# Patient Record
Sex: Female | Born: 1965 | Race: Black or African American | Hispanic: No | Marital: Single | Smoking: Never smoker
Health system: Southern US, Community
[De-identification: ages and names within clinical notes are randomized; demographics above are authoritative.]

## PROBLEM LIST (undated history)

## (undated) DIAGNOSIS — I509 Heart failure, unspecified: Secondary | ICD-10-CM

## (undated) DIAGNOSIS — I10 Essential (primary) hypertension: Secondary | ICD-10-CM

## (undated) HISTORY — DX: Heart failure, unspecified: I50.9

---

## 2003-06-23 ENCOUNTER — Other Ambulatory Visit: Payer: Self-pay

## 2004-05-10 ENCOUNTER — Emergency Department: Payer: Self-pay | Admitting: Unknown Physician Specialty

## 2004-12-05 ENCOUNTER — Emergency Department: Payer: Self-pay | Admitting: Emergency Medicine

## 2005-06-10 ENCOUNTER — Emergency Department: Payer: Self-pay | Admitting: Emergency Medicine

## 2007-06-28 ENCOUNTER — Emergency Department: Payer: Self-pay | Admitting: Emergency Medicine

## 2007-07-23 ENCOUNTER — Ambulatory Visit: Payer: Self-pay

## 2009-07-21 ENCOUNTER — Emergency Department: Payer: Self-pay | Admitting: Emergency Medicine

## 2009-10-08 ENCOUNTER — Emergency Department: Payer: Self-pay | Admitting: Emergency Medicine

## 2011-07-09 ENCOUNTER — Emergency Department: Payer: Self-pay | Admitting: Emergency Medicine

## 2011-09-05 ENCOUNTER — Ambulatory Visit: Payer: Self-pay | Admitting: Family Medicine

## 2012-10-30 ENCOUNTER — Ambulatory Visit: Payer: Self-pay | Admitting: Primary Care

## 2012-11-07 ENCOUNTER — Ambulatory Visit: Payer: Self-pay | Admitting: Primary Care

## 2013-03-23 ENCOUNTER — Emergency Department: Payer: Self-pay | Admitting: Internal Medicine

## 2013-03-23 LAB — URINALYSIS, COMPLETE
Bilirubin,UR: NEGATIVE
Blood: NEGATIVE
Glucose,UR: NEGATIVE mg/dL (ref 0–75)
Ketone: NEGATIVE
Leukocyte Esterase: NEGATIVE
Nitrite: NEGATIVE
Ph: 6 (ref 4.5–8.0)
Specific Gravity: 1.015 (ref 1.003–1.030)
WBC UR: 1 /HPF (ref 0–5)

## 2013-12-21 ENCOUNTER — Emergency Department: Payer: Self-pay | Admitting: Emergency Medicine

## 2013-12-21 LAB — COMPREHENSIVE METABOLIC PANEL
ALBUMIN: 3.4 g/dL (ref 3.4–5.0)
Alkaline Phosphatase: 62 U/L
Anion Gap: 5 — ABNORMAL LOW (ref 7–16)
BUN: 15 mg/dL (ref 7–18)
Bilirubin,Total: 0.2 mg/dL (ref 0.2–1.0)
CO2: 27 mmol/L (ref 21–32)
CREATININE: 1.02 mg/dL (ref 0.60–1.30)
Calcium, Total: 8.9 mg/dL (ref 8.5–10.1)
Chloride: 107 mmol/L (ref 98–107)
EGFR (African American): >60
EGFR (Non-African Amer.): >60
GLUCOSE: 107 mg/dL — AB (ref 65–99)
OSMOLALITY: 279 (ref 275–301)
Potassium: 3.7 mmol/L (ref 3.5–5.1)
SGOT(AST): 15 U/L (ref 15–37)
SGPT (ALT): 20 U/L
SODIUM: 139 mmol/L (ref 136–145)
TOTAL PROTEIN: 7.7 g/dL (ref 6.4–8.2)

## 2013-12-21 LAB — CBC WITH DIFFERENTIAL/PLATELET
BASOS PCT: 0.6 %
Basophil #: 0 10*3/uL (ref 0.0–0.1)
EOS ABS: 0.1 10*3/uL (ref 0.0–0.7)
Eosinophil %: 1.6 %
HCT: 36.8 % (ref 35.0–47.0)
HGB: 12.1 g/dL (ref 12.0–16.0)
LYMPHS PCT: 27.8 %
Lymphocyte #: 2 10*3/uL (ref 1.0–3.6)
MCH: 26.2 pg (ref 26.0–34.0)
MCHC: 32.8 g/dL (ref 32.0–36.0)
MCV: 80 fL (ref 80–100)
MONOS PCT: 9.7 %
Monocyte #: 0.7 x10 3/mm (ref 0.2–0.9)
NEUTROS ABS: 4.4 10*3/uL (ref 1.4–6.5)
NEUTROS PCT: 60.3 %
Platelet: 201 10*3/uL (ref 150–440)
RBC: 4.61 10*6/uL (ref 3.80–5.20)
RDW: 13.7 % (ref 11.5–14.5)
WBC: 7.3 10*3/uL (ref 3.6–11.0)

## 2013-12-21 LAB — URINALYSIS, COMPLETE
BACTERIA: NONE SEEN
BILIRUBIN, UR: NEGATIVE
Blood: NEGATIVE
GLUCOSE, UR: NEGATIVE mg/dL (ref 0–75)
Ketone: NEGATIVE
Leukocyte Esterase: NEGATIVE
Nitrite: NEGATIVE
Ph: 7 (ref 4.5–8.0)
Protein: NEGATIVE
RBC,UR: 1 /HPF (ref 0–5)
Specific Gravity: 1.02 (ref 1.003–1.030)
Squamous Epithelial: 14

## 2013-12-21 LAB — LIPASE, BLOOD: Lipase: 151 U/L (ref 73–393)

## 2013-12-21 LAB — TROPONIN I: Troponin-I: <0.02 ng/mL

## 2014-04-02 ENCOUNTER — Ambulatory Visit: Payer: Self-pay | Admitting: Primary Care

## 2015-08-07 ENCOUNTER — Emergency Department
Admission: EM | Admit: 2015-08-07 | Discharge: 2015-08-07 | Disposition: A | Discharge status: Home / Self Care | Payer: BLUE CROSS/BLUE SHIELD | Attending: Emergency Medicine | Admitting: Emergency Medicine

## 2015-08-07 ENCOUNTER — Emergency Department: Payer: BLUE CROSS/BLUE SHIELD

## 2015-08-07 ENCOUNTER — Encounter: Payer: Self-pay | Admitting: Emergency Medicine

## 2015-08-07 DIAGNOSIS — R51 Headache: Secondary | ICD-10-CM | POA: Insufficient documentation

## 2015-08-07 DIAGNOSIS — R519 Headache, unspecified: Secondary | ICD-10-CM

## 2015-08-07 DIAGNOSIS — I10 Essential (primary) hypertension: Secondary | ICD-10-CM | POA: Diagnosis not present

## 2015-08-07 HISTORY — DX: Essential (primary) hypertension: I10

## 2015-08-07 MED ORDER — PROMETHAZINE HCL 25 MG PO TABS
12.5000 mg | ORAL_TABLET | Freq: Once | ORAL | Status: AC
  Administered 2015-08-07: 12.5 mg via ORAL
  Filled 2015-08-07: qty 1

## 2015-08-07 MED ORDER — METOCLOPRAMIDE HCL 10 MG PO TABS
10.0000 mg | ORAL_TABLET | Freq: Once | ORAL | Status: AC
  Administered 2015-08-07: 10 mg via ORAL
  Filled 2015-08-07: qty 1

## 2015-08-07 MED ORDER — KETOROLAC TROMETHAMINE 30 MG/ML IJ SOLN
30.0000 mg | Freq: Once | INTRAMUSCULAR | Status: AC
  Administered 2015-08-07: 30 mg via INTRAMUSCULAR
  Filled 2015-08-07: qty 1
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
## 2015-08-07 NOTE — ED Notes (Signed)
AAOx3.  Skin warm and dry.  MAE equally and strong. Ambulates with easy and steady gait. 

## 2015-08-07 NOTE — Discharge Instructions (Signed)

## 2015-09-14 ENCOUNTER — Other Ambulatory Visit: Payer: Self-pay | Admitting: Primary Care

## 2015-09-14 DIAGNOSIS — Z1231 Encounter for screening mammogram for malignant neoplasm of breast: Secondary | ICD-10-CM

## 2015-09-25 ENCOUNTER — Emergency Department
Admission: EM | Admit: 2015-09-25 | Discharge: 2015-09-25 | Disposition: A | Discharge status: Home / Self Care | Payer: BLUE CROSS/BLUE SHIELD | Attending: Emergency Medicine | Admitting: Emergency Medicine

## 2015-09-25 ENCOUNTER — Emergency Department: Payer: BLUE CROSS/BLUE SHIELD

## 2015-09-25 ENCOUNTER — Encounter: Payer: Self-pay | Admitting: Emergency Medicine

## 2015-09-25 DIAGNOSIS — M7732 Calcaneal spur, left foot: Secondary | ICD-10-CM | POA: Insufficient documentation

## 2015-09-25 DIAGNOSIS — I10 Essential (primary) hypertension: Secondary | ICD-10-CM | POA: Insufficient documentation

## 2015-09-25 DIAGNOSIS — M722 Plantar fascial fibromatosis: Secondary | ICD-10-CM | POA: Insufficient documentation

## 2015-09-25 DIAGNOSIS — M7731 Calcaneal spur, right foot: Secondary | ICD-10-CM | POA: Insufficient documentation

## 2015-09-25 DIAGNOSIS — M2550 Pain in unspecified joint: Secondary | ICD-10-CM | POA: Diagnosis present

## 2015-09-25 DIAGNOSIS — M7730 Calcaneal spur, unspecified foot: Secondary | ICD-10-CM

## 2015-09-25 DIAGNOSIS — R52 Pain, unspecified: Secondary | ICD-10-CM

## 2015-09-25 MED ORDER — NAPROXEN 500 MG PO TABS: 500.0000 mg | ORAL_TABLET | Freq: Two times a day (BID) | ORAL | Status: DC

## 2015-09-25 NOTE — ED Provider Notes (Signed)
CSN: [id number]     Arrival date & time 09/25/15  1509 History   First MD Initiated Contact with Patient 09/25/15 1534     Chief Complaint  Patient presents with  . Foot Pain     HPI Comments: 50 year old female presents today complaining of bilateral heel pain for the past week. Reports the right heel pain radiates into her achilles tendon. She has never had this type of pain before. Has not had an injury to her feet or ankles recently. She has history of remote right 5th metatarsal fracture in the right foot. Taking OTC medications without relief of symptoms.   The history is provided by the patient.    Past Medical History  Diagnosis Date  . Hypertension    History reviewed. No pertinent past surgical history. No family history on file. Social History  Substance Use Topics  . Smoking status: Never Smoker   . Smokeless tobacco: None  . Alcohol Use: No   OB History    No data available     Review of Systems  Musculoskeletal: Positive for arthralgias. Negative for gait problem.  Skin: Negative for wound.  All other systems reviewed and are negative.     Allergies  Review of patient's allergies indicates no known allergies.  Home Medications   Prior to Admission medications   Medication Sig Start Date End Date Taking? Authorizing Provider  naproxen (NAPROSYN) 500 MG tablet Take 1 tablet (500 mg total) by mouth 2 (two) times daily with a meal. 09/25/15   Christella Scheuermann, PA-C   BP 104/64 mmHg  Pulse 88  Temp(Src) 98.2 F (36.8 C) (Oral)  Resp 20  Ht  (1.651 m)  Wt 81.647 kg  BMI 29.95 kg/m2  SpO2 100% Physical Exam  Constitutional: Vital signs are normal. She appears well-developed and well-nourished. She is active.  Musculoskeletal:       Right ankle: Normal.       Left ankle: Normal.       Right foot: Normal.       Left foot: Normal.       Feet:  Neurological: She is alert.  Nursing note and vitals reviewed.   ED Course  Procedures (including  critical care time) Labs Review Labs Reviewed - No data to display  Imaging Review Dg Foot Complete Left  09/25/2015  CLINICAL DATA:  Pain and swelling over both feet worse than left, no known injury, initial encounter EXAM: LEFT FOOT - COMPLETE 3+ VIEW COMPARISON:  None. FINDINGS: No acute fracture or dislocation is noted. Mild tarsal degenerative changes are seen. A small Achilles spur is noted. No acute soft tissue abnormality is noted. IMPRESSION: Negative. No acute abnormality noted. Electronically Signed   By: Alcide Clever M.D.   On: 09/25/2015 17:01   Dg Foot Complete Right  09/25/2015  CLINICAL DATA:  Bilateral foot pain right greater than left, no known injury, initial encounter EXAM: RIGHT FOOT COMPLETE - 3+ VIEW COMPARISON:  06/28/2007 FINDINGS: Small Achilles spur is noted. No acute fracture or dislocation is noted. No gross soft tissue abnormality is seen. Mild variant articulation between the third and fourth metatarsals is seen. This was present on the prior exam to a lesser degree. IMPRESSION: No acute abnormality noted. Electronically Signed   By: Alcide Clever M.D.   On: 09/25/2015 17:02   I have personally reviewed and evaluated these images and lab results as part of my medical decision-making.   EKG Interpretation None  MDM  Heel spurs noted on both foot xrays. Dicussed findings with patient. Handout given on plantar fascitiis Naproxen twice per day with food, follow up with podiatry for new or worsening symptoms Final diagnoses:  Pain  Heel spur, unspecified laterality  Plantar fasciitis        Christella Scheuermannmma Yazlyn Wentzel V, PA-C 09/25/15 1707  Jene Everyobert Kinner, MD 09/25/15 1940

## 2015-09-25 NOTE — ED Notes (Signed)
Bilateral heel pain x 1 week, denies injury.

## 2015-09-25 NOTE — ED Notes (Signed)
Discussed discharge instructions, prescriptions, and follow-up care with patient. No questions or concerns at this time. Pt stable at discharge.  

## 2015-09-25 NOTE — ED Notes (Signed)
See triage note. Pt presents with c/o pain to back of bil heels, right heel pain radiating into achilles tendon.

## 2015-09-27 ENCOUNTER — Ambulatory Visit: Payer: BLUE CROSS/BLUE SHIELD

## 2015-10-04 ENCOUNTER — Ambulatory Visit: Payer: BLUE CROSS/BLUE SHIELD

## 2015-10-20 ENCOUNTER — Ambulatory Visit
Admission: RE | Admit: 2015-10-20 | Discharge: 2015-10-20 | Disposition: A | Discharge status: Home / Self Care | Payer: BLUE CROSS/BLUE SHIELD | Source: Ambulatory Visit | Attending: Primary Care | Admitting: Primary Care

## 2015-10-20 DIAGNOSIS — Z1231 Encounter for screening mammogram for malignant neoplasm of breast: Secondary | ICD-10-CM | POA: Diagnosis not present

## 2016-09-19 ENCOUNTER — Other Ambulatory Visit: Payer: Self-pay | Admitting: Primary Care

## 2016-09-19 DIAGNOSIS — Z1231 Encounter for screening mammogram for malignant neoplasm of breast: Secondary | ICD-10-CM

## 2016-10-24 ENCOUNTER — Ambulatory Visit
Admission: RE | Admit: 2016-10-24 | Discharge: 2016-10-24 | Disposition: A | Discharge status: Home / Self Care | Payer: BLUE CROSS/BLUE SHIELD | Source: Ambulatory Visit | Attending: Primary Care | Admitting: Primary Care

## 2016-10-24 ENCOUNTER — Encounter: Payer: Self-pay | Admitting: Radiology

## 2016-10-24 DIAGNOSIS — Z1231 Encounter for screening mammogram for malignant neoplasm of breast: Secondary | ICD-10-CM

## 2017-11-10 ENCOUNTER — Other Ambulatory Visit: Payer: Self-pay

## 2017-11-10 ENCOUNTER — Emergency Department
Admission: EM | Admit: 2017-11-10 | Discharge: 2017-11-10 | Disposition: A | Discharge status: Home / Self Care | Payer: Self-pay | Attending: Emergency Medicine | Admitting: Emergency Medicine

## 2017-11-10 ENCOUNTER — Emergency Department: Payer: Self-pay

## 2017-11-10 ENCOUNTER — Encounter: Payer: Self-pay | Admitting: *Deleted

## 2017-11-10 DIAGNOSIS — R2241 Localized swelling, mass and lump, right lower limb: Secondary | ICD-10-CM | POA: Insufficient documentation

## 2017-11-10 DIAGNOSIS — I110 Hypertensive heart disease with heart failure: Secondary | ICD-10-CM | POA: Insufficient documentation

## 2017-11-10 DIAGNOSIS — R0602 Shortness of breath: Secondary | ICD-10-CM

## 2017-11-10 DIAGNOSIS — R079 Chest pain, unspecified: Secondary | ICD-10-CM | POA: Insufficient documentation

## 2017-11-10 DIAGNOSIS — I509 Heart failure, unspecified: Secondary | ICD-10-CM | POA: Insufficient documentation

## 2017-11-10 LAB — BASIC METABOLIC PANEL
ANION GAP: 6 (ref 5–15)
BUN: 15 mg/dL (ref 6–20)
CALCIUM: 8.7 mg/dL — AB (ref 8.9–10.3)
CO2: 22 mmol/L (ref 22–32)
Chloride: 112 mmol/L — ABNORMAL HIGH (ref 98–111)
Creatinine, Ser: 0.81 mg/dL (ref 0.44–1.00)
Glucose, Bld: 95 mg/dL (ref 70–99)
Potassium: 3.7 mmol/L (ref 3.5–5.1)
Sodium: 140 mmol/L (ref 135–145)

## 2017-11-10 LAB — LIPASE, BLOOD: LIPASE: 28 U/L (ref 11–51)

## 2017-11-10 LAB — TROPONIN I: Troponin I: <0.03 ng/mL (ref <0.03)

## 2017-11-10 LAB — HEPATIC FUNCTION PANEL
ALK PHOS: 42 U/L (ref 38–126)
ALT: 17 U/L (ref 0–44)
AST: 24 U/L (ref 15–41)
Albumin: 3.4 g/dL — ABNORMAL LOW (ref 3.5–5.0)
BILIRUBIN DIRECT: 0.1 mg/dL (ref 0.0–0.2)
BILIRUBIN INDIRECT: 0.4 mg/dL (ref 0.3–0.9)
TOTAL PROTEIN: 6.6 g/dL (ref 6.5–8.1)
Total Bilirubin: 0.5 mg/dL (ref 0.3–1.2)

## 2017-11-10 LAB — CBC
HCT: 34.6 % — ABNORMAL LOW (ref 35.0–47.0)
Hemoglobin: 11.4 g/dL — ABNORMAL LOW (ref 12.0–16.0)
MCH: 26.2 pg (ref 26.0–34.0)
MCHC: 32.9 g/dL (ref 32.0–36.0)
MCV: 79.7 fL — AB (ref 80.0–100.0)
PLATELETS: 181 10*3/uL (ref 150–440)
RBC: 4.33 MIL/uL (ref 3.80–5.20)
RDW: 13.8 % (ref 11.5–14.5)
WBC: 4.3 10*3/uL (ref 3.6–11.0)

## 2017-11-10 LAB — TSH: TSH: 0.673 u[IU]/mL (ref 0.350–4.500)

## 2017-11-10 LAB — FIBRIN DERIVATIVES D-DIMER (ARMC ONLY): Fibrin derivatives D-dimer (ARMC): 530.35 ng/mL (FEU) — ABNORMAL HIGH (ref 0.00–499.00)

## 2017-11-10 LAB — T4, FREE: Free T4: 1.03 ng/dL (ref 0.82–1.77)

## 2017-11-10 MED ORDER — FUROSEMIDE 20 MG PO TABS: 20.0000 mg | ORAL_TABLET | Freq: Every day | ORAL | 0 refills | Status: DC

## 2017-11-10 MED ORDER — FUROSEMIDE 10 MG/ML IJ SOLN
20.0000 mg | Freq: Once | INTRAMUSCULAR | Status: AC
  Administered 2017-11-10: 20 mg via INTRAVENOUS
  Filled 2017-11-10: qty 4

## 2017-11-10 MED ORDER — IOHEXOL 350 MG/ML SOLN
75.0000 mL | Freq: Once | INTRAVENOUS | Status: AC | PRN
  Administered 2017-11-10: 75 mL via INTRAVENOUS

## 2017-11-10 NOTE — ED Triage Notes (Signed)
Pt to ED reporting SOB, dizziness, headache, chest tightness and right leg swelling since yesterday. No hx of CHF, COPD or heart or lung problems. No neuro deficits noted. No increased WOB. Nonproductive cough reported last night. No fevers.   Pt also reporting neck and back pain that started today with nausea.

## 2017-11-10 NOTE — ED Provider Notes (Signed)
[facility], [facility] Emergency Department Provider Note  ____________________________________________  Time seen: Approximately 1:51 PM  I have reviewed the triage vital signs and the nursing notes.   HISTORY  Chief Complaint Shortness of Breath and Dizziness    HPI Mary Smith is a 52 y.o. female with a history of hypertension who complains of  right leg swelling that started yesterday, associated with chest tightness and shortness of breath that are intermittent without aggravating or alleviating factors.  No fevers chills or sweats.  She is also had a nonproductive cough during this time.  Denies pleuritic pain.  No dizziness or syncope.  Pain is nonradiating.  Moderate intensity.     Past Medical History:  Diagnosis Date  . Hypertension      There are no active problems to display for this patient.    History reviewed. No pertinent surgical history.   Prior to Admission medications   Medication Sig Start Date End Date Taking? Authorizing Provider  furosemide (LASIX) 20 MG tablet Take 1 tablet (20 mg total) by mouth daily. 11/10/17 11/10/18  Sharman CheekStafford, Kerrin Markman, MD  naproxen (NAPROSYN) 500 MG tablet Take 1 tablet (500 mg total) by mouth 2 (two) times daily with a meal. 09/25/15   Christella ScheuermannLawrence, Emma V, PA-C     Allergies Patient has no known allergies.   Family History  Problem Relation Age of Onset  . Breast cancer Mother [id number]  . Breast cancer Maternal Aunt   . Breast cancer Maternal Grandmother     Social History Social History   Tobacco Use  . Smoking status: Never Smoker  . Smokeless tobacco: Never Used  Substance Use Topics  . Alcohol use: No  . Drug use: Not on file    Review of Systems  Constitutional:   No fever or chills.  ENT:   No sore throat. No rhinorrhea. Cardiovascular:   Positive as above chest pain without syncope. Respiratory:   Positive shortness of breath and nonproductive cough. Gastrointestinal:   Negative for  abdominal pain, vomiting and diarrhea.  Musculoskeletal:   Positive right lower leg pain and swelling All other systems reviewed and are negative except as documented above in ROS and HPI.  ____________________________________________   PHYSICAL EXAM:  VITAL SIGNS: ED Triage Vitals  Enc Vitals Group     BP 11/10/17 1119 (!) 148/76     Pulse Rate 11/10/17 1119 76     Resp 11/10/17 1119 16     Temp 11/10/17 1119 97.8 F (36.6 C)     Temp Source 11/10/17 1119 Oral     SpO2 11/10/17 1119 98 %     Weight 11/10/17 1106 190 lb (86.2 kg)     Height 11/10/17 1106 5\' 5"  (1.651 m)     Head Circumference --      Peak Flow --      Pain Score 11/10/17 1106 10     Pain Loc --      Pain Edu? --      Excl. in GC? --     Vital signs reviewed, nursing assessments reviewed.   Constitutional:   Alert and oriented. Non-toxic appearance. Eyes:   Conjunctivae are normal. EOMI. PERRL. ENT      Head:   Normocephalic and atraumatic.      Nose:   No congestion/rhinnorhea.       Mouth/Throat:   MMM, no pharyngeal erythema. No peritonsillar mass.       Neck:   No meningismus. Full ROM. Hematological/Lymphatic/Immunilogical:  No cervical lymphadenopathy. Cardiovascular:   RRR. Symmetric bilateral radial and DP pulses.  No murmurs.  Respiratory:   Normal respiratory effort without tachypnea/retractions. Breath sounds are clear and equal bilaterally. No wheezes/rales/rhonchi. Gastrointestinal:   Soft with epigastric tenderness. Non distended. There is no CVA tenderness.  No rebound, rigidity, or guarding.  Musculoskeletal:   Normal range of motion in all extremities. No joint effusions.  Calf circumference right greater than left with mild tenderness in the posterior right calf proximally. Neurologic:   Normal speech and language.  Motor grossly intact. No acute focal neurologic deficits are appreciated.  Skin:    Skin is warm, dry and intact. No rash noted.  No petechiae, purpura, or  bullae.  ____________________________________________    LABS (pertinent positives/negatives) (all labs ordered are listed, but only abnormal results are displayed) Labs Reviewed  BASIC METABOLIC PANEL - Abnormal; Notable for the following components:      Result Value   Chloride 112 (*)    Calcium 8.7 (*)    All other components within normal limits  CBC - Abnormal; Notable for the following components:   Hemoglobin 11.4 (*)    HCT 34.6 (*)    MCV 79.7 (*)    All other components within normal limits  FIBRIN DERIVATIVES D-DIMER ([facility] ONLY) - Abnormal; Notable for the following components:   Fibrin derivatives D-dimer ([facility]) 530.35 (*)    All other components within normal limits  HEPATIC FUNCTION PANEL - Abnormal; Notable for the following components:   Albumin 3.4 (*)    All other components within normal limits  TROPONIN I  LIPASE, BLOOD  TSH  T4, FREE  TROPONIN I  POC URINE PREG, ED   ____________________________________________   EKG  Interpreted by me Normal sinus rhythm rate of 76, normal axis intervals QRS ST segments and T waves.  ____________________________________________    RADIOLOGY  Dg Chest 2 View  Result Date: 11/10/2017 CLINICAL DATA:  Shortness of breath. EXAM: CHEST - 2 VIEW COMPARISON:  10/09/2009 FINDINGS: Cardiomediastinal silhouette is normal. Mediastinal contours appear intact. There is no evidence of focal airspace consolidation, pleural effusion or pneumothorax. Mild pulmonary vascular congestion. Osseous structures are without acute abnormality. Soft tissues are grossly normal. IMPRESSION: Mild pulmonary vascular congestion. Electronically Signed   By: Ted Mcalpine M.D.   On: 11/10/2017 12:08   Ct Angio Chest Pe W And/or Wo Contrast  Result Date: 11/10/2017 CLINICAL DATA:  SOB, dizziness, headache, mid chest pain, chest tightness and right leg swelling since yesterday. No hx of CHF, COPD or heart or lung problems. No neuro deficits  noted. No increased WOB. Nonproductive cough reported last night. Denies hx of blood clots. EXAM: CT ANGIOGRAPHY CHEST WITH CONTRAST TECHNIQUE: Multidetector CT imaging of the chest was performed using the standard protocol during bolus administration of intravenous contrast. Multiplanar CT image reconstructions and MIPs were obtained to evaluate the vascular anatomy. CONTRAST:  75mL OMNIPAQUE IOHEXOL 350 MG/ML SOLN COMPARISON:  None. FINDINGS: Cardiovascular: Mild four-chamber cardiac enlargement. No pericardial effusion. Satisfactory opacification of pulmonary arteries noted, and there is no evidence of pulmonary emboli. Scattered coronary calcifications. Adequate contrast opacification of the thoracic aorta with no evidence of dissection, aneurysm, or stenosis. There is classic 3-vessel brachiocephalic arch anatomy without proximal stenosis. Minimal calcified atheromatous plaque in the descending thoracic segment. Visualized proximal abdominal aorta unremarkable. Mediastinum/Nodes: No hilar or mediastinal adenopathy. Lungs/Pleura: Trace bilateral pleural effusions. Interstitial edema with peripheral septal thickening. No pneumothorax. Upper Abdomen: No acute findings. Musculoskeletal: Vertebral endplate  spurring at multiple levels in the mid and lower thoracic spine. Negative for fracture or worrisome bone lesion. Review of the MIP images confirms the above findings. IMPRESSION: 1. Negative for acute PE or thoracic aortic dissection. 2. Mild cardiomegaly, interstitial edema, and small pleural effusions suggesting CHF. Electronically Signed   By: Corlis Leak M.D.   On: 11/10/2017 15:20   US Venous Img Lower Unilateral Right  Result Date: 11/10/2017 CLINICAL DATA:  52 year old female with right lower extremity pain and swelling for 1 day EXAM: RIGHT LOWER EXTREMITY VENOUS DOPPLER ULTRASOUND TECHNIQUE: Gray-scale sonography with graded compression, as well as color Doppler and duplex ultrasound were performed to  evaluate the lower extremity deep venous systems from the level of the common femoral vein and including the common femoral, femoral, profunda femoral, popliteal and calf veins including the posterior tibial, peroneal and gastrocnemius veins when visible. The superficial great saphenous vein was also interrogated. Spectral Doppler was utilized to evaluate flow at rest and with distal augmentation maneuvers in the common femoral, femoral and popliteal veins. COMPARISON:  None. FINDINGS: Contralateral Common Femoral Vein: Respiratory phasicity is normal and symmetric with the symptomatic side. No evidence of thrombus. Normal compressibility. Common Femoral Vein: No evidence of thrombus. Normal compressibility, respiratory phasicity and response to augmentation. Saphenofemoral Junction: No evidence of thrombus. Normal compressibility and flow on color Doppler imaging. Profunda Femoral Vein: No evidence of thrombus. Normal compressibility and flow on color Doppler imaging. Femoral Vein: No evidence of thrombus. Normal compressibility, respiratory phasicity and response to augmentation. Popliteal Vein: No evidence of thrombus. Normal compressibility, respiratory phasicity and response to augmentation. Calf Veins: No evidence of thrombus. Normal compressibility and flow on color Doppler imaging. Superficial Great Saphenous Vein: No evidence of thrombus. Normal compressibility. Venous Reflux:  None. Other Findings:  None. IMPRESSION: No evidence of deep venous thrombosis. Electronically Signed   By: Malachy Moan M.D.   On: 11/10/2017 14:25    ____________________________________________   PROCEDURES Procedures  ____________________________________________  DIFFERENTIAL DIAGNOSIS   Right lower leg DVT, bronchitis, pulmonary embolism, non-STEMI.  Low suspicion for dissection AAA pneumothorax pneumonia or myocarditis.   CLINICAL IMPRESSION / ASSESSMENT AND PLAN / ED COURSE  Pertinent labs & imaging  results that were available during my care of the patient were reviewed by me and considered in my medical decision making (see chart for details).    Patient presents with right lower leg pain as well as intermittent chest pain shortness of breath.  She is nontoxic in appearance, vital signs are unremarkable.  Presentation is atypical.  Initial BMP and CBC are unremarkable.  I will add on LFTs because of her upper abdominal pain, ultrasound right lower leg to evaluate for DVT, thyroid study, d-dimer to risk stratify for VTE.  If all negative I would discharge to follow-up with primary care given overall lack of risk factors, heart score low risk.  Clinical Course as of Nov 11 1538  Sat Nov 10, 2017  1409 Labs unremarkable except d-dimer 530. If [place] RLE negative for DVT, will obtain CTA chest. If [place] positive, will start anticoagulation.     [PS]  1428 [place] negative for DVT. Will obtain CTA chest to eval for PE given elevated d dimer.   US Venous Img Lower Unilateral Right [PS]    Clinical Course User Index [PS] Sharman Cheek, MD      ----------------------------------------- 3:40 PM on 11/10/2017 -----------------------------------------  Repeat troponin negative, CTA chest negative for PE.  She does have signs of  mild congestive heart failure.  Vital signs are unremarkable, she is not in respiratory distress and oxygen saturation remains in the high 90s.  Suitable for initiating outpatient therapy with oral Lasix and follow-up in heart failure clinic.  Results discussed with patient including return precautions. ____________________________________________   FINAL CLINICAL IMPRESSION(S) / ED DIAGNOSES    Final diagnoses:  Acute congestive heart failure, unspecified heart failure type (HCC)  Shortness of breath     ED Discharge Orders        Ordered    furosemide (LASIX) 20 MG tablet  Daily     11/10/17 1535      Portions of this note were generated with dragon dictation  software. Dictation errors may occur despite best attempts at proofreading.    Sharman Cheek, MD 11/10/17 1540

## 2017-11-10 NOTE — ED Notes (Signed)
Pt in NAD Sitting in a wheelchair with family. Pt updated as best as possible. Vitals rechecked. No changes in symptoms at this time. New warm blanket provided.

## 2017-11-10 NOTE — Discharge Instructions (Addendum)
Your tests today do not show any signs of heart attack, pneumonia, or blood clots in the body.  It does appear that you are having some excess retained fluid in the body that is accumulating in your lungs and leg, causing these symptoms. Please start taking furosemide once daily as prescribed and follow up with cardiology clinic or heart failure clinic this week for further evaluation.

## 2017-11-10 NOTE — ED Notes (Signed)
Patient transported to Ultrasound 

## 2017-11-14 ENCOUNTER — Ambulatory Visit: Payer: Self-pay

## 2017-11-16 ENCOUNTER — Ambulatory Visit: Payer: Self-pay | Attending: Family | Admitting: Family

## 2017-11-16 ENCOUNTER — Encounter: Payer: Self-pay | Admitting: Family

## 2017-11-16 VITALS — BP 109/76 | HR 79 | Resp 18 | Ht 65.0 in | Wt 204.4 lb

## 2017-11-16 DIAGNOSIS — Z79899 Other long term (current) drug therapy: Secondary | ICD-10-CM | POA: Insufficient documentation

## 2017-11-16 DIAGNOSIS — I110 Hypertensive heart disease with heart failure: Secondary | ICD-10-CM | POA: Insufficient documentation

## 2017-11-16 DIAGNOSIS — Z803 Family history of malignant neoplasm of breast: Secondary | ICD-10-CM | POA: Insufficient documentation

## 2017-11-16 DIAGNOSIS — I509 Heart failure, unspecified: Secondary | ICD-10-CM | POA: Insufficient documentation

## 2017-11-16 DIAGNOSIS — I10 Essential (primary) hypertension: Secondary | ICD-10-CM | POA: Insufficient documentation

## 2017-11-16 LAB — BASIC METABOLIC PANEL
ANION GAP: 10 (ref 5–15)
BUN: 18 mg/dL (ref 6–20)
CO2: 27 mmol/L (ref 22–32)
Calcium: 9.6 mg/dL (ref 8.9–10.3)
Chloride: 102 mmol/L (ref 98–111)
Creatinine, Ser: 0.96 mg/dL (ref 0.44–1.00)
GFR calc Af Amer: >60 mL/min (ref >60)
GFR calc non Af Amer: >60 mL/min (ref >60)
GLUCOSE: 101 mg/dL — AB (ref 70–99)
Potassium: 3.9 mmol/L (ref 3.5–5.1)
Sodium: 139 mmol/L (ref 135–145)

## 2017-11-16 NOTE — Patient Instructions (Signed)
Continue weighing daily and call for an overnight weight gain of > 2 pounds or a weekly weight gain of >5 pounds. 

## 2017-11-16 NOTE — Progress Notes (Signed)
Patient ID: Mary Smith, female    DOB: [birth year], 52 y.o.   MRN: [id number]  HPI  Mary Smith is a 52 y/o female with a history of HTN and chronic heart failure.   No echo report.   Was in the ED 11/10/17 due to acute heart failure. Labs normal and ultrasound was negative for DVT so patient was released.  She presents for her initial visit with a chief complaint of minimal fatigue upon moderate exertion. She describes this as acute in nature over the last few months. She does feel like it's improving, she has associated shortness of breath, neck pain and light-headedness along with this. She denies any difficulty sleeping, abdominal distention, palpitations, pedal edema or chest pain. Does not have scales at home.   Past Medical History:  Diagnosis Date  . CHF (congestive heart failure) (HCC)   . Hypertension    History reviewed. No pertinent surgical history. Family History  Problem Relation Age of Onset  . Breast cancer Mother 36  . Breast cancer Maternal Aunt   . Breast cancer Maternal Grandmother    Social History   Tobacco Use  . Smoking status: Never Smoker  . Smokeless tobacco: Never Used  Substance Use Topics  . Alcohol use: No   No Known Allergies Prior to Admission medications   Medication Sig Start Date End Date Taking? Authorizing Provider  Biotin w/ Vitamins C & E (HAIR SKIN & NAILS GUMMIES) 1250-7.5-7.5 MCG-MG-UNT CHEW Chew 2 tablets by mouth daily.   Yes [provider]  furosemide (LASIX) 20 MG tablet Take 1 tablet (20 mg total) by mouth daily. 11/10/17 11/10/18 Yes Sharman Cheek, MD  lisinopril (PRINIVIL,ZESTRIL) 20 MG tablet Take 20 mg by mouth daily.   Yes [provider]  Multiple Vitamins-Minerals (ALIVE WOMENS GUMMY) CHEW Chew 2 tablets by mouth daily.   Yes [provider]    Review of Systems  Constitutional: Positive for fatigue (with moderate exertion). Negative for appetite change.  HENT: Negative for congestion,  rhinorrhea and sore throat.   Eyes: Negative.   Respiratory: Positive for shortness of breath (on occasion). Negative for chest tightness.   Cardiovascular: Negative for chest pain, palpitations and leg swelling.  Gastrointestinal: Negative for abdominal distention and abdominal pain.  Endocrine: Negative.   Genitourinary: Negative.   Musculoskeletal: Positive for neck pain. Negative for back pain.  Skin: Negative.   Allergic/Immunologic: Negative.   Neurological: Positive for light-headedness. Negative for dizziness.  Hematological: Negative for adenopathy. Does not bruise/bleed easily.  Psychiatric/Behavioral: Negative for dysphoric mood and sleep disturbance (sleeping on 3 pillows). The patient is not nervous/anxious.    Vitals:   11/16/17 0904  BP: 109/76  Pulse: 79  Resp: 18  SpO2: 100%  Weight: 204 lb 6 oz (92.7 kg)  Height: 5\' 5"  (1.651 m)   Wt Readings from Last 3 Encounters:  11/16/17 204 lb 6 oz (92.7 kg)  11/10/17 190 lb (86.2 kg)  09/25/15 180 lb (81.6 kg)   Lab Results  Component Value Date   CREATININE 0.81 11/10/2017   CREATININE 1.02 12/21/2013   Physical Exam  Constitutional: She is oriented to person, place, and time. She appears well-developed and well-nourished.  HENT:  Head: Normocephalic and atraumatic.  Neck: Normal range of motion. Neck supple. No JVD present.  Cardiovascular: Normal rate and regular rhythm.  Pulmonary/Chest: Effort normal. No respiratory distress. She has no wheezes. She has no rales.  Abdominal: Soft. She exhibits no distension.  Musculoskeletal:  Right lower leg: She exhibits no tenderness and no edema.       Left lower leg: She exhibits no tenderness and no edema.  Neurological: She is alert and oriented to person, place, and time.  Skin: Skin is warm and dry.  Psychiatric: She has a normal mood and affect. Her behavior is normal.  Nursing note and vitals reviewed.  Assessment & Plan:  1: Chronic heart failure with  unknown ejection fraction- - NYHA class II - euvolemic today - echo to be ordered - not weighing daily as she doesn't have any scales so a set was given to her today. Instructed to weigh every morning and call for an overnight weight gain of >2 pounds or a weekly weight gain of >5 pounds - not adding salt and has been reading food labels. Discussed the importance of keeping daily sodium intake to 2000mg  daily and written dietary information was given to her about this - will get BMP today since she has been started on furosemide - PharmD reconciled medications with the patient - has cardiology Mary Smith(Fath) appointment scheduled for 12/06/17. [facility] and if she gets approved, she will [facility]  2: HTN- - BP looks good today - follows with PCP Seward Carol(Rubio[facility] - BMP from 11/10/17 reviewed and showed sodium 140, potassium 3.7 and GFR >60  Patient did not bring her medications nor a list. Each medication was verbally reviewed with the patient and she was encouraged to bring the bottles to every visit to confirm accuracy of list.  Return in 1 month or sooner for any questions/problems before then.

## 2017-12-17 ENCOUNTER — Ambulatory Visit: Payer: Self-pay | Admitting: Family

## 2017-12-19 ENCOUNTER — Ambulatory Visit
Admission: RE | Admit: 2017-12-19 | Discharge: 2017-12-19 | Disposition: A | Discharge status: Home / Self Care | Payer: Self-pay | Source: Ambulatory Visit | Attending: Oncology | Admitting: Oncology

## 2017-12-19 ENCOUNTER — Encounter (INDEPENDENT_AMBULATORY_CARE_PROVIDER_SITE_OTHER): Payer: Self-pay

## 2017-12-19 ENCOUNTER — Ambulatory Visit: Payer: Self-pay | Attending: Oncology | Admitting: *Deleted

## 2017-12-19 ENCOUNTER — Encounter: Payer: Self-pay | Admitting: *Deleted

## 2017-12-19 VITALS — BP 118/76 | HR 69 | Temp 97.8°F | Resp 12 | Ht 66.0 in | Wt 202.0 lb

## 2017-12-19 DIAGNOSIS — N63 Unspecified lump in unspecified breast: Secondary | ICD-10-CM

## 2017-12-19 NOTE — Progress Notes (Addendum)
  Subjective:     Patient ID: Mary Smith, female   DOB: 11/27/1965, 52 y.o.   MRN: [id number]  HPI   Review of Systems     Objective:   Physical Exam  Pulmonary/Chest: Right breast exhibits no inverted nipple, no mass, no nipple discharge, no skin change and no tenderness. Left breast exhibits no inverted nipple, no mass, no nipple discharge, no skin change and no tenderness.         Assessment:     52 year old Black female [facility] and mammogram only.  Clinical breast exam unremarkable.  Taught self breast awareness.  Last pap on 09/15/16 was negative without HPV co-testing.  Next pap due in 2021.  Patient has been screened for eligibility.  She does not have any insurance, Medicare or Medicaid.  She also meets financial eligibility.  Hand-out given on the Affordable Care Act. Risk Assessment    Risk Scores      12/19/2017   Last edited by: Vanita PandaMiceli, Loretta T, RN   5-year risk: 1.5 %   Lifetime risk: 10.4 %            Plan:     Screening mammogram ordered.  Will follow-up per [facility] protocol.

## 2017-12-19 NOTE — Patient Instructions (Signed)
Gave patient hand-out, Women Staying Healthy, Active and Well from [facility], with education on breast health, pap smears, heart and colon health. 

## 2017-12-20 ENCOUNTER — Encounter: Payer: Self-pay | Admitting: *Deleted

## 2017-12-20 NOTE — Progress Notes (Signed)
Letter mailed from [facility] to inform patient of her normal mammogram results.  Patient is to follow-up with annual screening in one year.  HSIS to Christy. 

## 2017-12-24 ENCOUNTER — Encounter: Payer: Self-pay | Admitting: Family

## 2017-12-24 ENCOUNTER — Ambulatory Visit: Payer: Self-pay | Attending: Family | Admitting: Family

## 2017-12-24 VITALS — BP 109/64 | HR 71 | Resp 18 | Ht 64.0 in | Wt 199.5 lb

## 2017-12-24 DIAGNOSIS — Z79899 Other long term (current) drug therapy: Secondary | ICD-10-CM | POA: Insufficient documentation

## 2017-12-24 DIAGNOSIS — I509 Heart failure, unspecified: Secondary | ICD-10-CM | POA: Insufficient documentation

## 2017-12-24 DIAGNOSIS — I10 Essential (primary) hypertension: Secondary | ICD-10-CM

## 2017-12-24 DIAGNOSIS — I110 Hypertensive heart disease with heart failure: Secondary | ICD-10-CM | POA: Insufficient documentation

## 2017-12-24 NOTE — Progress Notes (Signed)
Patient ID: Bartolo Darternita M Breit, female    DOB: 05/02/1966, 52 y.o.   MRN: [id number]  HPI  Ms Lorella NimrodHarvey is a 52 y/o female with a history of HTN and chronic heart failure.   No echo report.   Was in the ED 11/10/17 due to acute heart failure. Labs normal and ultrasound was negative for DVT so patient was released.  She presents for a follow-up visit with a chief complaint of minimal fatigue upon moderate exertion. She describes this as being present for several months and has been improving recently. She has associated light-headedness and tooth pain (recent dental work) along with this. She denies any shortness of breath, cough, chest pain, pedal edema, palpitations, abdominal distention, difficulty sleeping or weight gain.   Past Medical History:  Diagnosis Date  . CHF (congestive heart failure) (HCC)   . Hypertension    No past surgical history on file. Family History  Problem Relation Age of Onset  . Breast cancer Mother [id number]  . Breast cancer Maternal Aunt   . Breast cancer Maternal Grandmother    Social History   Tobacco Use  . Smoking status: Never Smoker  . Smokeless tobacco: Never Used  Substance Use Topics  . Alcohol use: No   No Known Allergies  Prior to Admission medications   Medication Sig Start Date End Date Taking? Authorizing Provider  Biotin w/ Vitamins C & E (HAIR SKIN & NAILS GUMMIES) 1250-7.5-7.5 MCG-MG-UNT CHEW Chew 2 tablets by mouth daily.   Yes [provider]  furosemide (LASIX) 20 MG tablet Take 1 tablet (20 mg total) by mouth daily. Patient taking differently: Take 20 mg by mouth as needed.  11/10/17 11/10/18 Yes Sharman CheekStafford, Phillip, MD  HYDROcodone-acetaminophen (NORCO/VICODIN) 5-325 MG tablet Take 1 tablet by mouth every 6 (six) hours as needed for moderate pain.   Yes [provider]  ibuprofen (ADVIL,MOTRIN) 600 MG tablet Take 600 mg by mouth every 6 (six) hours as needed for moderate pain.   Yes [provider]  lisinopril  (PRINIVIL,ZESTRIL) 20 MG tablet Take 20 mg by mouth daily.   Yes [provider]  Multiple Vitamins-Minerals (ALIVE WOMENS GUMMY) CHEW Chew 2 tablets by mouth daily.   Yes [provider]    Review of Systems  Constitutional: Positive for fatigue (improving). Negative for appetite change.  HENT: Positive for dental problem (teeth sore due to recent tooth extraction). Negative for congestion, rhinorrhea and sore throat.   Eyes: Negative.   Respiratory: Negative for chest tightness and shortness of breath.   Cardiovascular: Negative for chest pain, palpitations and leg swelling.  Gastrointestinal: Negative for abdominal distention and abdominal pain.  Endocrine: Negative.   Genitourinary: Negative.   Musculoskeletal: Positive for neck pain. Negative for back pain.  Skin: Negative.   Allergic/Immunologic: Negative.   Neurological: Positive for light-headedness. Negative for dizziness.  Hematological: Negative for adenopathy. Does not bruise/bleed easily.  Psychiatric/Behavioral: Negative for dysphoric mood and sleep disturbance (sleeping on 3 pillows). The patient is not nervous/anxious.    Vitals:   12/24/17 0822  BP: 109/64  Pulse: 71  Resp: 18  SpO2: 100%  Weight: 199 lb 8 oz (90.5 kg)  Height: 5\' 4"  (1.626 m)   Wt Readings from Last 3 Encounters:  12/24/17 199 lb 8 oz (90.5 kg)  12/19/17 202 lb (91.6 kg)  11/16/17 204 lb 6 oz (92.7 kg)   Lab Results  Component Value Date   CREATININE 0.96 11/16/2017   CREATININE 0.81 11/10/2017  CREATININE 1.02 12/21/2013    Physical Exam  Constitutional: She is oriented to person, place, and time. She appears well-developed and well-nourished.  HENT:  Head: Normocephalic and atraumatic.  Neck: Normal range of motion. Neck supple. No JVD present.  Cardiovascular: Normal rate and regular rhythm.  Pulmonary/Chest: Effort normal. No respiratory distress. She has no wheezes. She has no rales.  Abdominal: Soft. She  exhibits no distension.  Musculoskeletal:       Right lower leg: She exhibits no tenderness and no edema.       Left lower leg: She exhibits no tenderness and no edema.  Neurological: She is alert and oriented to person, place, and time.  Skin: Skin is warm and dry.  Psychiatric: She has a normal mood and affect. Her behavior is normal.  Nursing note and vitals reviewed.  Assessment & Plan:  1: Chronic heart failure with unknown ejection fraction- - NYHA class II - euvolemic today - will get echo ordered as that hasn't been done yet - weighing daily; reminded to weigh every morning and call for an overnight weight gain of >2 pounds or a weekly weight gain of >5 pounds - weight down 5 pounds since she was last here 5 weeks ago - not adding salt and has been reading food labels. Reminded to keep daily sodium intake to 2000mg  daily - saw cardiology (Fath) 12/06/17  2: HTN- - BP looks good today - follows with P[facility] Seward Carol(Rubio[facility] - BMP from 11/16/17 reviewed and showed sodium 139, potassium 3.9 and GFR >60  Patient did not bring her medications nor a list. Each medication was verbally reviewed with the patient and she was encouraged to bring the bottles to every visit to confirm accuracy of list.  Return in 3 months or sooner for any questions/problems before then.

## 2017-12-24 NOTE — Patient Instructions (Signed)
Continue weighing daily and call for an overnight weight gain of > 2 pounds or a weekly weight gain of >5 pounds. 

## 2017-12-31 ENCOUNTER — Ambulatory Visit
Admission: RE | Admit: 2017-12-31 | Discharge: 2017-12-31 | Disposition: A | Discharge status: Home / Self Care | Payer: Self-pay | Source: Ambulatory Visit | Attending: Family | Admitting: Family

## 2017-12-31 DIAGNOSIS — I110 Hypertensive heart disease with heart failure: Secondary | ICD-10-CM | POA: Insufficient documentation

## 2017-12-31 DIAGNOSIS — I509 Heart failure, unspecified: Secondary | ICD-10-CM | POA: Insufficient documentation

## 2017-12-31 NOTE — Progress Notes (Signed)
*  PRELIMINARY RESULTS* Echocardiogram 2D Echocardiogram has been performed.  Mary GulaJoan M Gearldene Smith 12/31/2017, 10:43 AM

## 2018-03-25 ENCOUNTER — Ambulatory Visit: Payer: Self-pay | Attending: Family | Admitting: Family

## 2018-03-25 ENCOUNTER — Encounter: Payer: Self-pay | Admitting: Family

## 2018-03-25 VITALS — BP 119/70 | HR 71 | Resp 18 | Ht 64.0 in | Wt 202.0 lb

## 2018-03-25 DIAGNOSIS — I10 Essential (primary) hypertension: Secondary | ICD-10-CM

## 2018-03-25 DIAGNOSIS — I110 Hypertensive heart disease with heart failure: Secondary | ICD-10-CM | POA: Insufficient documentation

## 2018-03-25 DIAGNOSIS — Z79899 Other long term (current) drug therapy: Secondary | ICD-10-CM | POA: Insufficient documentation

## 2018-03-25 DIAGNOSIS — I5032 Chronic diastolic (congestive) heart failure: Secondary | ICD-10-CM | POA: Insufficient documentation

## 2018-03-25 NOTE — Patient Instructions (Addendum)
Continue weighing daily and call for an overnight weight gain of > 2 pounds or a weekly weight gain of >5 pounds.  Take fluid pill for above weight gain, increased swelling or worsening shortness of breath.

## 2018-03-25 NOTE — Progress Notes (Signed)
Patient ID: Mary Smith, female    DOB: 1966-02-26, 52 y.o.   MRN: [id number]  HPI  Mary Smith is a 52 y/o female with a history of HTN and chronic heart failure.   Echo report from 12/31/17 reviewed and showed an EF of 55-60%.  Was in the ED 11/10/17 due to acute heart failure. Labs normal and ultrasound was negative for DVT so patient was released.  She presents for a follow-up visit with a chief complaint of minimal fatigue upon moderate exertion. She describes this as chronic in nature having been present for several years. She has associated light-headedness, occasional shortness of breath and slight weight gain along with this. She denies any difficulty sleeping, abdominal pain, palpitations, pedal edema, chest pain or cough. Did take lasix last week because she felt like she was retaining fluid and she was more short of breath.   Past Medical History:  Diagnosis Date  . CHF (congestive heart failure) (HCC)   . Hypertension    No past surgical history on file. Family History  Problem Relation Age of Onset  . Breast cancer Mother 36  . Breast cancer Maternal Aunt   . Breast cancer Maternal Grandmother    Social History   Tobacco Use  . Smoking status: Never Smoker  . Smokeless tobacco: Never Used  Substance Use Topics  . Alcohol use: No   No Known Allergies  Prior to Admission medications   Medication Sig Start Date End Date Taking? Authorizing Provider  Biotin w/ Vitamins C & E (HAIR SKIN & NAILS GUMMIES) 1250-7.5-7.5 MCG-MG-UNT CHEW Chew 2 tablets by mouth daily.   Yes [provider]  furosemide (LASIX) 20 MG tablet Take 1 tablet (20 mg total) by mouth daily. Patient taking differently: Take 20 mg by mouth as needed.  11/10/17 11/10/18 Yes Sharman Cheek, MD  ibuprofen (ADVIL,MOTRIN) 600 MG tablet Take 600 mg by mouth every 6 (six) hours as needed for moderate pain.   Yes [provider]  lisinopril (PRINIVIL,ZESTRIL) 20 MG tablet Take 20 mg by mouth  daily.   Yes [provider]  Multiple Vitamins-Minerals (ALIVE WOMENS GUMMY) CHEW Chew 2 tablets by mouth daily.   Yes [provider]    Review of Systems  Constitutional: Positive for fatigue (with moderate exertion). Negative for appetite change.  HENT: Negative for congestion, rhinorrhea and sore throat.   Eyes: Negative.   Respiratory: Positive for shortness of breath (at times). Negative for cough and chest tightness.   Cardiovascular: Negative for chest pain, palpitations and leg swelling.  Gastrointestinal: Negative for abdominal distention and abdominal pain.  Endocrine: Negative.   Genitourinary: Negative.   Musculoskeletal: Positive for neck pain. Negative for back pain.  Skin: Negative.   Allergic/Immunologic: Negative.   Neurological: Positive for light-headedness (last week). Negative for dizziness.  Hematological: Negative for adenopathy. Does not bruise/bleed easily.  Psychiatric/Behavioral: Negative for dysphoric mood and sleep disturbance (sleeping on 3 pillows). The patient is not nervous/anxious.    Vitals:   03/25/18 0854  BP: 119/70  Pulse: 71  Resp: 18  SpO2: 100%  Weight: 202 lb (91.6 kg)  Height: 5\' 4"  (1.626 m)   Wt Readings from Last 3 Encounters:  03/25/18 202 lb (91.6 kg)  12/24/17 199 lb 8 oz (90.5 kg)  12/19/17 202 lb (91.6 kg)   Lab Results  Component Value Date   CREATININE 0.96 11/16/2017   CREATININE 0.81 11/10/2017   CREATININE 1.02 12/21/2013   Physical Exam  Constitutional:  She is oriented to person, place, and time. She appears well-developed and well-nourished.  HENT:  Head: Normocephalic and atraumatic.  Neck: Normal range of motion. Neck supple. No JVD present.  Cardiovascular: Normal rate and regular rhythm.  Pulmonary/Chest: Effort normal. No respiratory distress. She has no wheezes. She has no rales.  Abdominal: Soft. She exhibits no distension.  Musculoskeletal:       Right lower leg: She exhibits no  tenderness and no edema.       Left lower leg: She exhibits no tenderness and no edema.  Neurological: She is alert and oriented to person, place, and time.  Skin: Skin is warm and dry.  Psychiatric: She has a normal mood and affect. Her behavior is normal.  Nursing note and vitals reviewed.  Assessment & Plan:  1: Chronic heart failure with preserved ejection fraction- - NYHA class II - euvolemic today - weighing daily; reminded to weigh every morning and call for an overnight weight gain of >2 pounds or a weekly weight gain of >5 pounds - weight up 3 pounds from last visit 3 months ago - discussed that she should take her lasix for above weight gain, increased edema or worsening shortness of breath. Also advised her to write down when she takes the lasix so that she can track how often she's using it - not adding salt and has been reading food labels. Reminded to keep daily sodium intake to 2000mg  daily - low sodium cookbook and nutritional information for various restaurants were given to her - saw cardiology Mary Smith) 12/06/17 - Pharm D reconciled medications with the patient  2: HTN- - BP looks good today - follows with PCP Mary Smith) at [facility] - BMP from 11/16/17 reviewed and showed sodium 139, potassium 3.9 and GFR >60  Patient did not bring her medications nor a list. Each medication was verbally reviewed with the patient and she was encouraged to bring the bottles to every visit to confirm accuracy of list.  Return in 6 months or sooner for any questions/problems before then.

## 2018-09-23 ENCOUNTER — Ambulatory Visit: Payer: Self-pay | Attending: Family | Admitting: Family

## 2018-09-23 ENCOUNTER — Encounter: Payer: Self-pay | Admitting: Family

## 2018-09-23 ENCOUNTER — Telehealth: Payer: Self-pay

## 2018-09-23 ENCOUNTER — Other Ambulatory Visit: Payer: Self-pay

## 2018-09-23 VITALS — Wt 200.0 lb

## 2018-09-23 DIAGNOSIS — M542 Cervicalgia: Secondary | ICD-10-CM

## 2018-09-23 DIAGNOSIS — I5032 Chronic diastolic (congestive) heart failure: Secondary | ICD-10-CM

## 2018-09-23 DIAGNOSIS — I10 Essential (primary) hypertension: Secondary | ICD-10-CM

## 2018-09-23 NOTE — Telephone Encounter (Signed)
TELEPHONE CALL NOTE  Mary Smith has been deemed a candidate for a follow-up tele-health visit to limit community exposure during the Covid-19 pandemic. I spoke with the patient via phone to ensure availability of phone/video source, confirm preferred email & phone number, discuss instructions and expectations, and review consent.   I reminded Mary Smith to be prepared with any vital sign and/or heart rhythm information that could potentially be obtained via home monitoring, at the time of her visit.  Finally, I reminded Mary Smith to expect an e-mail containing a link for their video-based visit approximately 15 minutes before her visit, or alternatively, a phone call at the time of her visit if her visit is planned to be a phone encounter.  Did the patient verbally consent to treatment as below? YES  Mary Smith, CMA 09/23/2018 8:44 AM  CONSENT FOR TELE-HEALTH VISIT - PLEASE REVIEW  I hereby voluntarily request, consent and authorize [facility] and its employed or contracted physicians, physician assistants, nurse practitioners or other licensed health care professionals (the Practitioner), to provide me with telemedicine health care services (the "Services") as deemed necessary by the treating Practitioner. I acknowledge and consent to receive the Services by the Practitioner via telemedicine. I understand that the telemedicine visit will involve communicating with the Practitioner through telephonic communication technology and the disclosure of certain medical information by electronic transmission. I acknowledge that I have been given the opportunity to request an in-person assessment or other available alternative prior to the telemedicine visit and am voluntarily participating in the telemedicine visit.  I understand that I have the right to withhold or withdraw my consent to the use of telemedicine in the course of my care at any time, without affecting my  right to future care or treatment, and that the Practitioner or I may terminate the telemedicine visit at any time. I understand that I have the right to inspect all information obtained and/or recorded in the course of the telemedicine visit and may receive copies of available information for a reasonable fee.  I understand that some of the potential risks of receiving the Services via telemedicine include:  Marland Kitchen Delay or interruption in medical evaluation due to technological equipment failure or disruption; . Information transmitted may not be sufficient (e.g. poor resolution of images) to allow for appropriate medical decision making by the Practitioner; and/or  . In rare instances, security protocols could fail, causing a breach of personal health information.  Furthermore, I acknowledge that it is my responsibility to provide information about my medical history, conditions and care that is complete and accurate to the best of my ability. I acknowledge that Practitioner's advice, recommendations, and/or decision may be based on factors not within their control, such as incomplete or inaccurate data provided by me or lack of visual representation. I understand that the practice of medicine is not an exact science and that Practitioner makes no warranties or guarantees regarding treatment outcomes. I acknowledge that I will receive a copy of this consent concurrently upon execution via email to the email address I last provided but may also request a printed copy by calling the office of [facility].    I understand that my insurance may be billed for this visit.   I have read or had this consent read to me. . I understand the contents of this consent, which adequately explains the benefits and risks of the Services being provided via telemedicine.  Marland Kitchen  I have been provided ample opportunity to ask questions regarding this consent and the Services and have had my questions answered to my  satisfaction. . I give my informed consent for the services to be provided through the use of telemedicine in my medical care  By participating in this telemedicine visit I agree to the above.

## 2018-09-23 NOTE — Progress Notes (Signed)
Virtual Visit via Telephone Note    Evaluation Performed:  Follow-up visit  This visit type was conducted due to national recommendations for restrictions regarding the COVID-19 Pandemic (e.g. social distancing).  This format is felt to be most appropriate for this patient at this time.  All issues noted in this document were discussed and addressed.  No physical exam was performed (except for noted visual exam findings with Video Visits).  Please refer to the patient's chart (MyChart message for video visits and phone note for telephone visits) for the patient's consent to telehealth for [facility]  Date:  09/23/2018   ID:  Mary Smith, DOB 06/22/1965, MRN [id number]  Patient Location:  [address] [place] [address]   Provider location:   [facility] [address] [place], [place] [address]  PCP:  Sandrea Hughs, NP  Cardiologist: Harold Hedge, MD Electrophysiologist:  None   Chief Complaint:  fatigue  History of Present Illness:    Mary Smith is a 53 y.o. female who presents via audio/video conferencing for a telehealth visit today.  Patient verified DOB and address.  The patient does not have symptoms concerning for COVID-19 infection (fever, chills, cough, or new SHORTNESS OF BREATH).   Patient reports minimal fatigue upon moderate exertion. She describes this as chronic in nature having been present for several years. She has associated rhinorrhea, palpitations, intermittent light-headedness and neck pain (throbbing) along with this. She denies any swelling in her legs/ abdomen, chest pain, shortness of breath, cough, head congestion, difficulty sleeping or weight gain.   Prior CV studies:   The following studies were reviewed today:  Echo report from 12/31/17 reviewed and showed an EF of 55-60%  Past Medical History:  Diagnosis Date  . CHF (congestive heart failure) (HCC)   . Hypertension    No past surgical history on file.    Current Meds  Medication Sig  . Biotin w/ Vitamins C & E (HAIR SKIN & NAILS GUMMIES) 1250-7.5-7.5 MCG-MG-UNT CHEW Chew 2 tablets by mouth daily.  . furosemide (LASIX) 20 MG tablet Take 1 tablet (20 mg total) by mouth daily. (Patient taking differently: Take 20 mg by mouth as needed. )  . ibuprofen (ADVIL,MOTRIN) 600 MG tablet Take 600 mg by mouth every 6 (six) hours as needed for moderate pain.  Marland Kitchen lisinopril (PRINIVIL,ZESTRIL) 20 MG tablet Take 20 mg by mouth daily.  . Multiple Vitamins-Minerals (ALIVE WOMENS GUMMY) CHEW Chew 2 tablets by mouth daily.     Allergies:   Patient has no known allergies.   Social History   Tobacco Use  . Smoking status: Never Smoker  . Smokeless tobacco: Never Used  Substance Use Topics  . Alcohol use: No  . Drug use: Not on file     Family Hx: The patient's family history includes Breast cancer in her maternal aunt and maternal grandmother; Breast cancer (age of onset: 55) in her mother.  ROS:   Please see the history of present illness.     All other systems reviewed and are negative.   Labs/Other Tests and Data Reviewed:    Recent Labs: 11/10/2017: ALT 17; Hemoglobin 11.4; Platelets 181; TSH 0.673 11/16/2017: BUN 18; Creatinine, Ser 0.96; Potassium 3.9; Sodium 139   Recent Lipid Panel No results found for: CHOL, TRIG, HDL, CHOLHDL, LDLCALC, LDLDIRECT  Wt Readings from Last 3 Encounters:  09/23/18 200 lb (90.7 kg)  03/25/18 202 lb (91.6 kg)  12/24/17 199 lb 8  oz (90.5 kg)     Exam:    Vital Signs:  Wt 200 lb (90.7 kg) Comment: self-reported  LMP  (LMP Unknown)   BMI 34.33 kg/m    Well nourished, well developed female in no  acute distress.   ASSESSMENT & PLAN:    1. Chronic heart failure with preserved ejection fraction- - NYHA class II - euvolemic today based on patient's description of symptoms - weighing daily and she says that her weight has been stable;  reminded to weigh every morning and call for an overnight weight  gain of >2 pounds or a weekly weight gain of >5 pounds - taking furosemide PRN and she says that she took it a few days ago due to some fluid retention; not taking it often - not adding salt and has been reading food labels. Reminded to keep daily sodium intake to 2000mg  daily - saw cardiology (Fath) 12/06/17  2: HTN- - reports checking her BP at home but she can't recall what it was yesterday - [facility] Seward Carol(Rubio[facility] - BMP from 11/16/17 reviewed and showed sodium 139, potassium 3.9, creatinine 0.96 and GFR >60  3: Neck pain- - describes this as throbbing in nature  - not sure if she pulled a muscle - advised her to try taking some ibuprofen twice daily with food, apply heat and call her PCP for further instruction  COVID-19 Education: The signs and symptoms of COVID-19 were discussed with the patient and how to seek care for testing (follow up with PCP or arrange E-visit).  The importance of social distancing was discussed today.  Patient Risk:   After full review of this patients clinical status, I feel that they are at least moderate risk at this time.  Time:   Today, I have spent 7 minutes with the patient with telehealth technology discussing medications, weight and symptoms to call about.     Medication Adjustments/Labs and Tests Ordered: Current medicines are reviewed at length with the patient today.  Concerns regarding medicines are outlined above.   Tests Ordered: No orders of the defined types were placed in this encounter.  Medication Changes: No orders of the defined types were placed in this encounter.   Disposition:  Follow-up in 6 months or sooner for any questions/problems before then.   Signed, Delma Freezeina A Jasmond River, FNP  09/23/2018 8:42 AM    [facility]

## 2018-09-23 NOTE — Patient Instructions (Signed)
Continue weighing daily and call for an overnight weight gain of > 2 pounds or a weekly weight gain of >5 pounds. 

## 2018-09-23 NOTE — Telephone Encounter (Signed)
   TELEPHONE CALL NOTE  This patient has been deemed a candidate for follow-up tele-health visit to limit community exposure during the Covid-19 pandemic. I spoke with the patient via phone to discuss instructions. The patient was advised to review the section on consent for treatment as well. The patient will receive a phone call 2-3 days prior to their E-Visit at which time consent will be verbally confirmed. A Virtual Office Visit appointment type has been scheduled for 09/23/2018 with Clarisa Kindred FNP.  [name], CMA 09/23/2018 8:43 AM

## 2018-12-13 ENCOUNTER — Encounter: Payer: Self-pay | Admitting: Family

## 2018-12-13 ENCOUNTER — Other Ambulatory Visit: Payer: Self-pay | Admitting: Family

## 2018-12-13 MED ORDER — FUROSEMIDE 20 MG PO TABS: 20.0000 mg | ORAL_TABLET | ORAL | 5 refills | Status: AC | PRN

## 2019-03-05 ENCOUNTER — Other Ambulatory Visit: Payer: Self-pay | Admitting: Primary Care

## 2019-03-05 DIAGNOSIS — Z1231 Encounter for screening mammogram for malignant neoplasm of breast: Secondary | ICD-10-CM

## 2019-03-21 NOTE — Progress Notes (Signed)
Patient ID: Mary Smith, female    DOB: 12-22-1965, 53 y.o.   MRN: [id number]  HPI  Mary Smith is Smith 53 y/o female with Smith history of HTN and chronic heart failure.   Echo report from 12/31/17 reviewed and showed an EF of 55-60%.  Has not been admitted or been in the ED in the last 6 months.   She presents for Smith follow-up visit with Smith chief complaint of minimal fatigue upon moderate exertion. She describes this as chronic in nature having been present for several years. She has associated shoulder pain, intermittent light-headedness and slight weight gain along with this. She denies any difficulty sleeping, abdominal distention, palpitations, pedal edema, chest pain, shortness of breath or cough.   She says that she has to call her PCP and make an appointment to discuss her shoulder/neck pain.   Past Medical History:  Diagnosis Date  . CHF (congestive heart failure) ([place])   . Hypertension    No past surgical history on file. Family History  Problem Relation Age of Onset  . Breast cancer Mother 7  . Breast cancer Maternal Aunt   . Breast cancer Maternal Grandmother    Social History   Tobacco Use  . Smoking status: Never Smoker  . Smokeless tobacco: Never Used  Substance Use Topics  . Alcohol use: No   No Known Allergies  Prior to Admission medications   Medication Sig Start Date End Date Taking? Authorizing Provider  Biotin w/ Vitamins C & E (HAIR SKIN & NAILS GUMMIES) 1250-7.5-7.5 MCG-MG-UNT CHEW Chew 2 tablets by mouth daily.   Yes [provider]  furosemide (LASIX) 20 MG tablet Take 1 tablet (20 mg total) by mouth as needed. 12/13/18 12/13/19 Yes Mary Atayde A, FNP  ibuprofen (ADVIL,MOTRIN) 600 MG tablet Take 600 mg by mouth every 6 (six) hours as needed for moderate pain.   Yes [provider]  lisinopril (PRINIVIL,ZESTRIL) 20 MG tablet Take 20 mg by mouth daily.   Yes [provider]  Multiple Vitamins-Minerals (ALIVE WOMENS GUMMY) CHEW Chew  2 tablets by mouth daily.   Yes [provider]    Review of Systems  Constitutional: Positive for fatigue (with moderate exertion). Negative for appetite change.  HENT: Negative for congestion, rhinorrhea and sore throat.   Eyes: Negative.   Respiratory: Negative for cough, chest tightness and shortness of breath.   Cardiovascular: Negative for chest pain, palpitations and leg swelling.  Gastrointestinal: Negative for abdominal distention and abdominal pain.  Endocrine: Negative.   Genitourinary: Negative.   Musculoskeletal: Positive for arthralgias (right shoulder pain), back pain and neck pain (right side of neck).  Skin: Negative.   Allergic/Immunologic: Negative.   Neurological: Positive for light-headedness (last week). Negative for dizziness.  Hematological: Negative for adenopathy. Does not bruise/bleed easily.  Psychiatric/Behavioral: Negative for dysphoric mood and sleep disturbance (sleeping on 3 pillows). The patient is not nervous/anxious.    Vitals:   03/24/19 0841  BP: 123/85  Pulse: 92  Resp: 20  SpO2: 100%  Weight: 212 lb 3.2 oz (96.3 kg)  Height: 5\' 5"  (1.651 m)   Wt Readings from Last 3 Encounters:  03/24/19 212 lb 3.2 oz (96.3 kg)  09/23/18 200 lb (90.7 kg)  03/25/18 202 lb (91.6 kg)   Lab Results  Component Value Date   CREATININE 0.96 11/16/2017   CREATININE 0.81 11/10/2017   CREATININE 1.02 12/21/2013    Physical Exam Vitals signs and nursing note reviewed.  Constitutional:  Appearance: She is well-developed.  HENT:     Head: Normocephalic and atraumatic.  Neck:     Musculoskeletal: Normal range of motion and neck supple.     Vascular: No JVD.  Cardiovascular:     Rate and Rhythm: Normal rate and regular rhythm.  Pulmonary:     Effort: Pulmonary effort is normal. No respiratory distress.     Breath sounds: No wheezing or rales.  Abdominal:     General: There is no distension.     Palpations: Abdomen is soft.   Musculoskeletal:     Right lower leg: She exhibits no tenderness. No edema.     Left lower leg: She exhibits no tenderness. No edema.  Skin:    General: Skin is warm and dry.  Neurological:     Mental Status: She is alert and oriented to person, place, and time.  Psychiatric:        Behavior: Behavior normal.    Assessment & Plan:  1: Chronic heart failure with preserved ejection fraction- - NYHA class II - euvolemic today - weighing daily; reminded to weigh every morning and call for an overnight weight gain of >2 pounds or Smith weekly weight gain of >5 pounds - weight up 10 pounds from last visit 1 year ago - not adding salt and has been reading food labels. Reminded to keep daily sodium intake to 2000mg  daily - takes her furosemide on Smith PRN basis - saw cardiology (Mary Smith) 12/06/17 - has not received her flu vaccine yet  2: HTN- - BP looks good today - follows with PCP Mary Smith) at [facility] and she has to call and make an appointment - BMP from 11/16/17 reviewed and showed sodium 139, potassium 3.9 and GFR >60  Medication bottles were reviewed.   Will not make Smith follow-up appointment with patient at this time. Advised her to continue weighing and monitoring her sodium intake. She can call back at anytime to schedule an appointment.

## 2019-03-24 ENCOUNTER — Other Ambulatory Visit: Payer: Self-pay

## 2019-03-24 ENCOUNTER — Encounter: Payer: Self-pay | Admitting: Family

## 2019-03-24 ENCOUNTER — Ambulatory Visit: Payer: BLUE CROSS/BLUE SHIELD | Attending: Family | Admitting: Family

## 2019-03-24 VITALS — BP 123/85 | HR 92 | Resp 20 | Ht 65.0 in | Wt 212.2 lb

## 2019-03-24 DIAGNOSIS — M542 Cervicalgia: Secondary | ICD-10-CM | POA: Insufficient documentation

## 2019-03-24 DIAGNOSIS — R42 Dizziness and giddiness: Secondary | ICD-10-CM | POA: Insufficient documentation

## 2019-03-24 DIAGNOSIS — Z79899 Other long term (current) drug therapy: Secondary | ICD-10-CM | POA: Diagnosis not present

## 2019-03-24 DIAGNOSIS — Z803 Family history of malignant neoplasm of breast: Secondary | ICD-10-CM | POA: Diagnosis not present

## 2019-03-24 DIAGNOSIS — I10 Essential (primary) hypertension: Secondary | ICD-10-CM

## 2019-03-24 DIAGNOSIS — M25519 Pain in unspecified shoulder: Secondary | ICD-10-CM | POA: Insufficient documentation

## 2019-03-24 DIAGNOSIS — I110 Hypertensive heart disease with heart failure: Secondary | ICD-10-CM | POA: Insufficient documentation

## 2019-03-24 DIAGNOSIS — I509 Heart failure, unspecified: Secondary | ICD-10-CM | POA: Diagnosis present

## 2019-03-24 DIAGNOSIS — I5032 Chronic diastolic (congestive) heart failure: Secondary | ICD-10-CM | POA: Diagnosis not present

## 2019-03-24 NOTE — Patient Instructions (Addendum)
Continue weighing daily and call for an overnight weight gain of > 2 pounds or a weekly weight gain of >5 pounds.  Call us for any questions/ problems in the future.

## 2019-05-22 ENCOUNTER — Ambulatory Visit
Admission: RE | Admit: 2019-05-22 | Discharge: 2019-05-22 | Disposition: A | Discharge status: Home / Self Care | Payer: 59 | Source: Ambulatory Visit | Attending: Primary Care | Admitting: Primary Care

## 2019-05-22 DIAGNOSIS — Z1231 Encounter for screening mammogram for malignant neoplasm of breast: Secondary | ICD-10-CM | POA: Insufficient documentation

## 2019-12-08 ENCOUNTER — Other Ambulatory Visit: Payer: Self-pay | Admitting: Primary Care

## 2019-12-08 DIAGNOSIS — Z1231 Encounter for screening mammogram for malignant neoplasm of breast: Secondary | ICD-10-CM

## 2020-04-21 IMAGING — US US EXTREM LOW VENOUS*R*
1 series · 13 of 24 positions shown · non-contrast
Comparison: None.

CLINICAL DATA: 52-year-old female with right lower extremity pain
and swelling for 1 day



[Series 1: us extrem low venous*right* · 0.07mm/px · 13 of 34 slices shown]
[im 1/34]
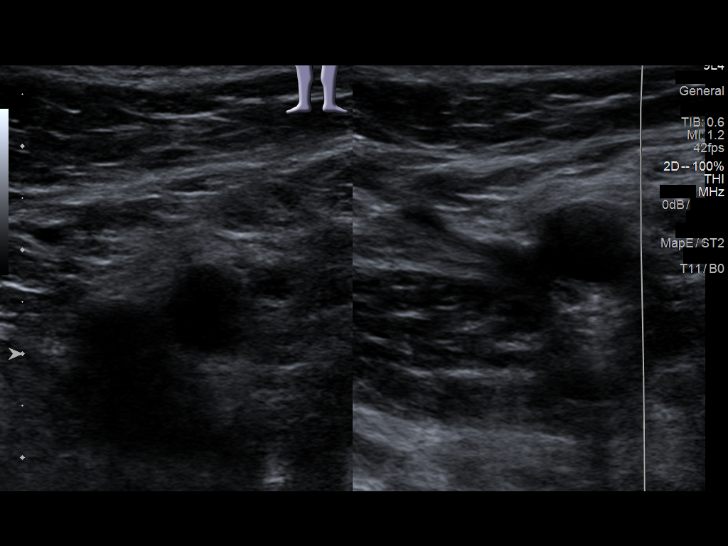
[im 3/34]
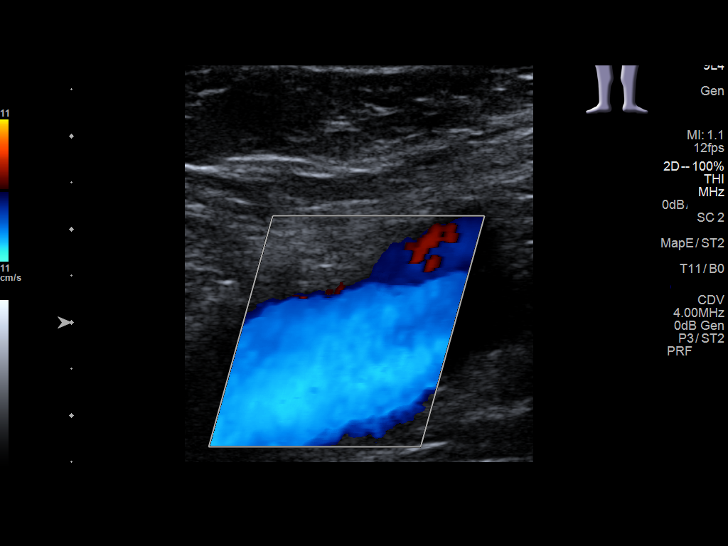
[im 6/34]
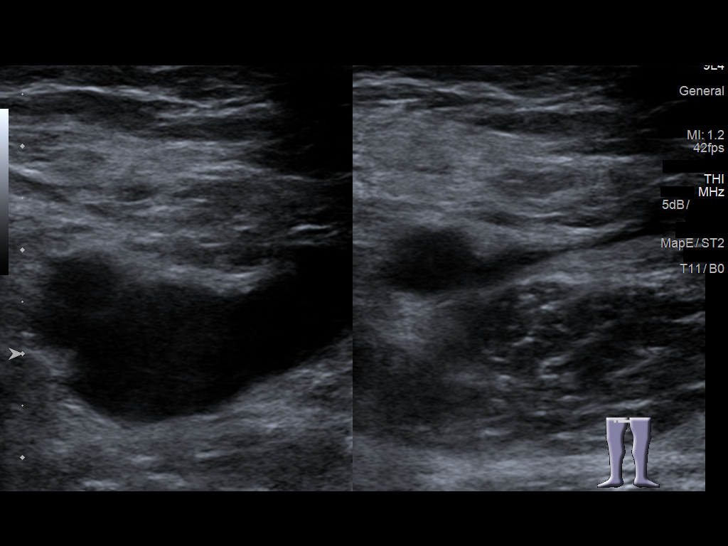
[im 9/34]
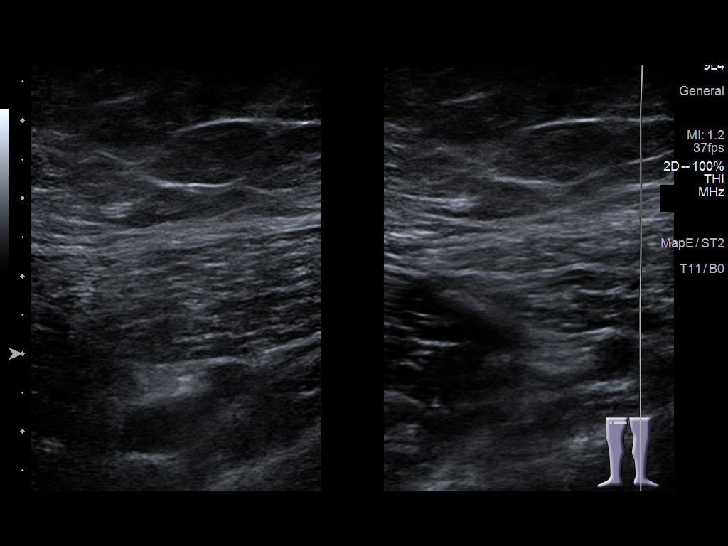
[im 12/34]
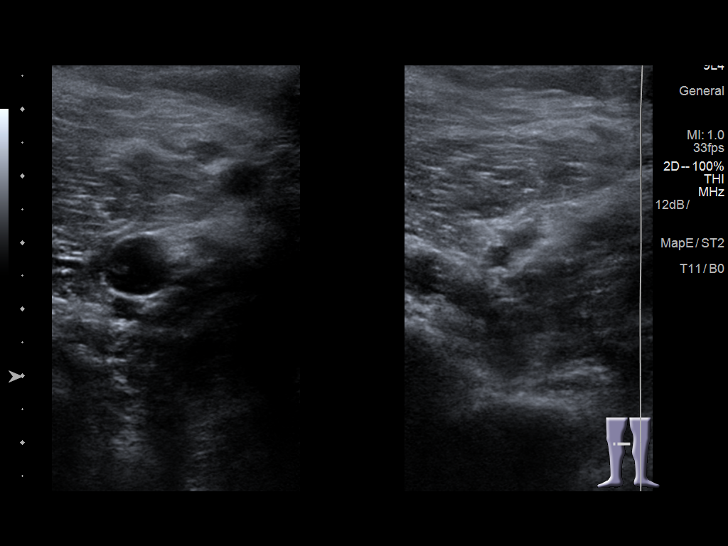
[im 15/34]
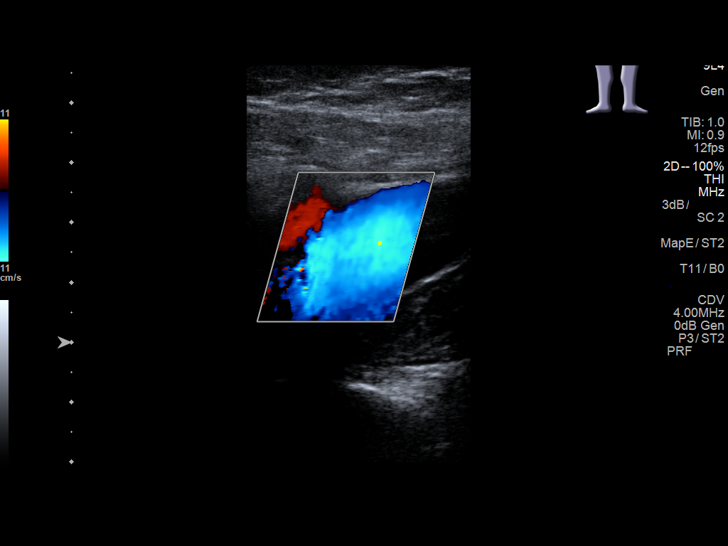
[im 18/34]
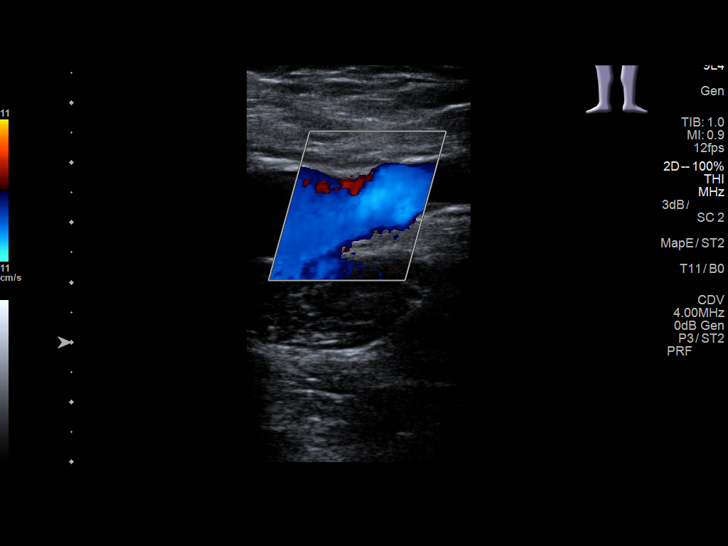
[im 19/34]
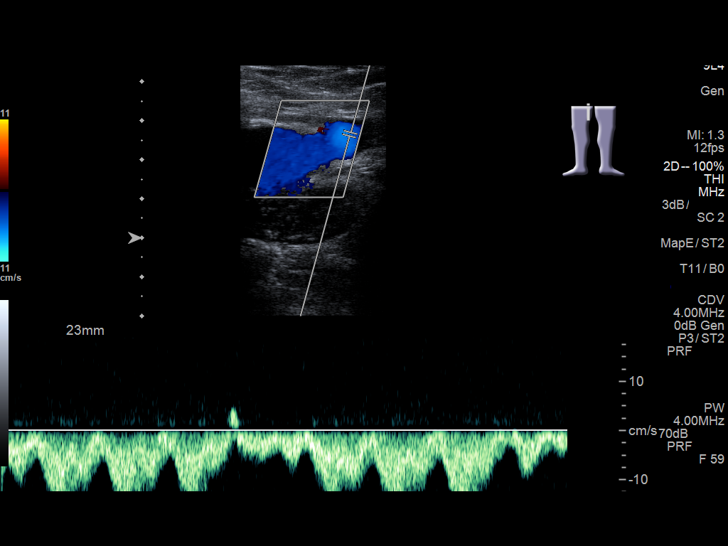
[im 22/34]
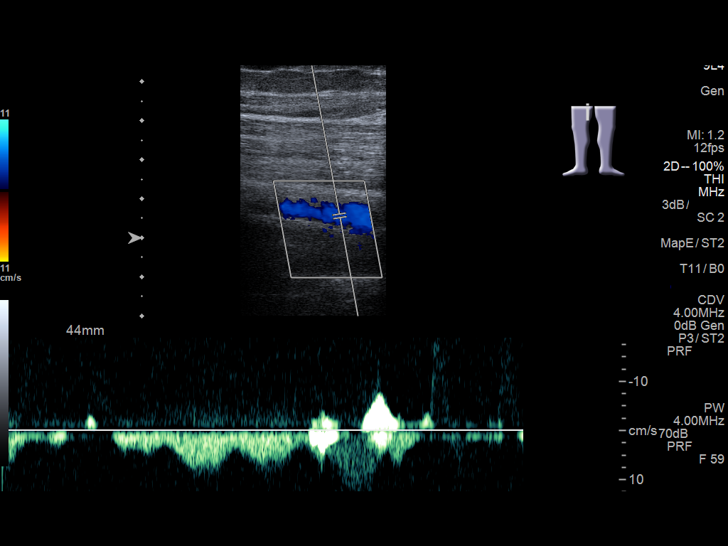
[im 25/34]
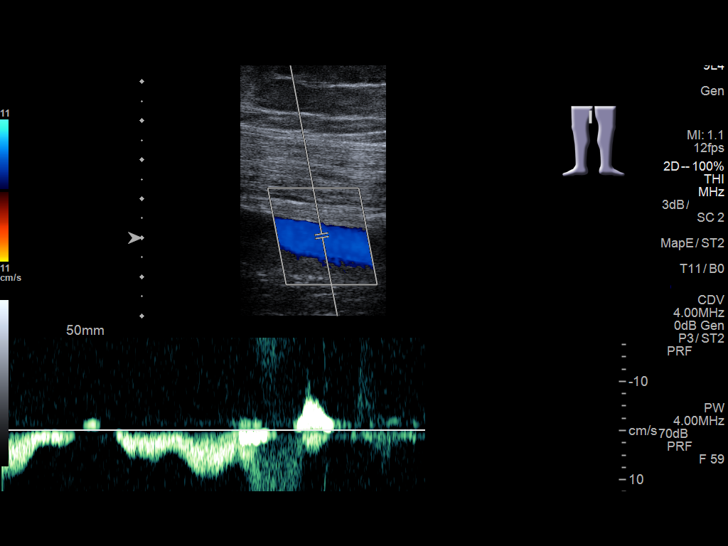
[im 28/34]
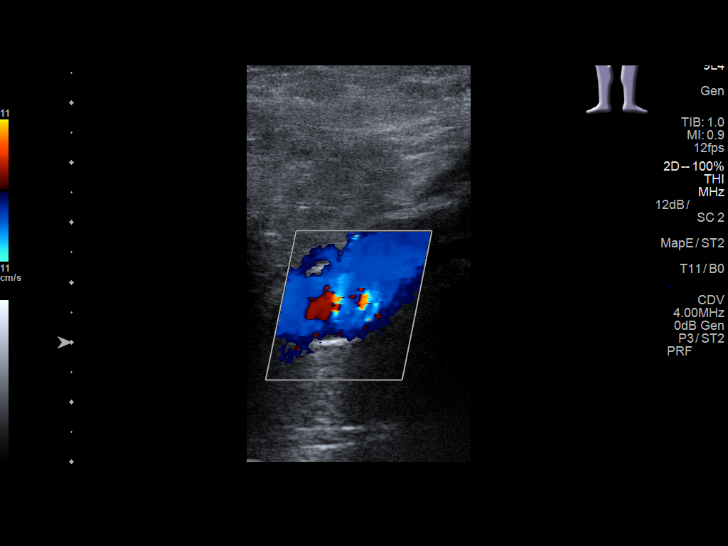
[im 31/34]
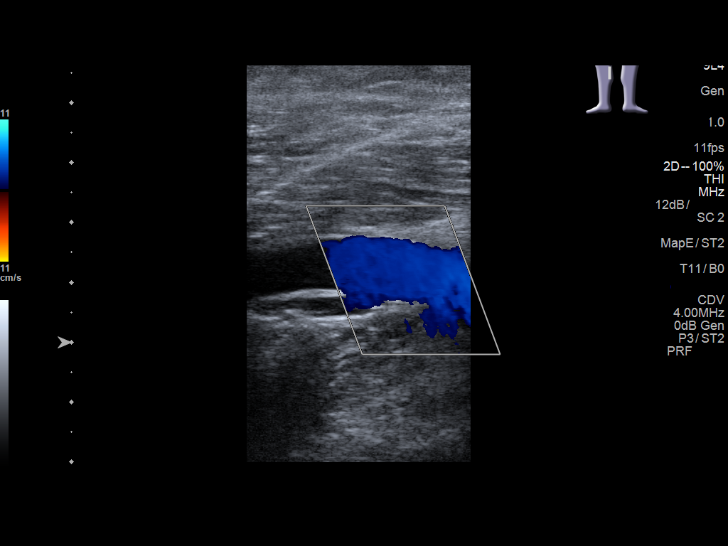
[im 34/34]
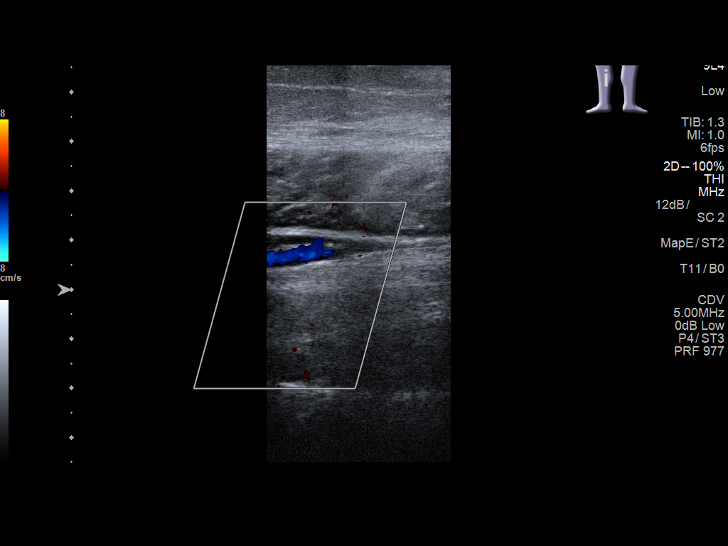

[13 of 24 positions shown; findings below may reference images not displayed]

FINDINGS: Contralateral Common Femoral Vein: Respiratory phasicity is normal
and symmetric with the symptomatic side. No evidence of thrombus.
Normal compressibility.

Common Femoral Vein: No evidence of thrombus. Normal
compressibility, respiratory phasicity and response to augmentation.

Saphenofemoral Junction: No evidence of thrombus. Normal
compressibility and flow on color Doppler imaging.

Profunda Femoral Vein: No evidence of thrombus. Normal
compressibility and flow on color Doppler imaging.

Femoral Vein: No evidence of thrombus. Normal compressibility,
respiratory phasicity and response to augmentation.

Popliteal Vein: No evidence of thrombus. Normal compressibility,
respiratory phasicity and response to augmentation.

Calf Veins: No evidence of thrombus. Normal compressibility and flow
on color Doppler imaging.

Superficial Great Saphenous Vein: No evidence of thrombus. Normal
compressibility.

Venous Reflux:  None.

Other Findings:  None.
IMPRESSION: No evidence of deep venous thrombosis.

## 2020-04-26 IMAGING — CT CT ANGIO CHEST
2 of 7 series · 18 of 46 positions shown · IV contrast (APPLIED)
Comparison: None.

CLINICAL DATA: SOB, dizziness, headache, mid chest pain, chest
tightness and right leg swelling since yesterday. No hx of CHF, COPD
or heart or lung problems. No neuro deficits noted. No increased
WOB. Nonproductive cough reported last night. Denies hx of blood
clots.

EXAM:
CT ANGIOGRAPHY CHEST WITH CONTRAST
TECHNIQUE: Multidetector CT imaging of the chest was performed using the
standard protocol during bolus administration of intravenous
contrast. Multiplanar CT image reconstructions and MIPs were
obtained to evaluate the vascular anatomy.
CONTRAST:  75mL OMNIPAQUE IOHEXOL 350 MG/ML SOLN

[Series 6: thins · axial · 0.60mm/px · z∈[-714,-474]mm · 15 of 272 slices shown]
[im 16/272  lung]
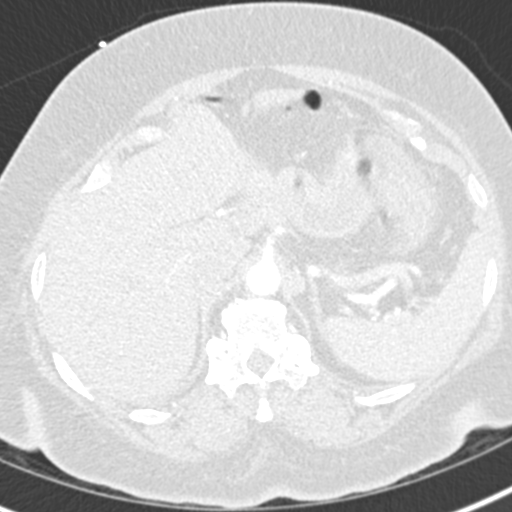
[im 31/272  soft-tissue]
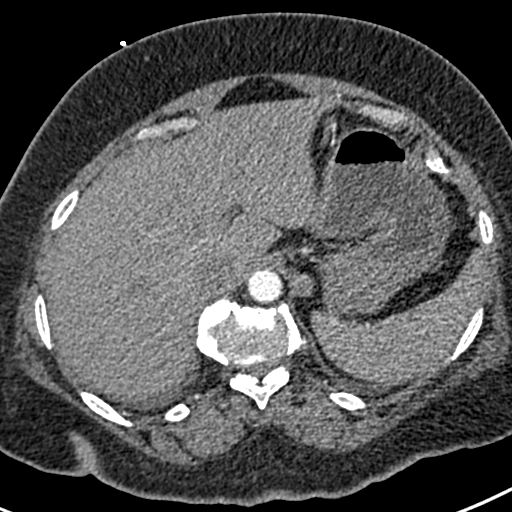
[im 46/272  lung]
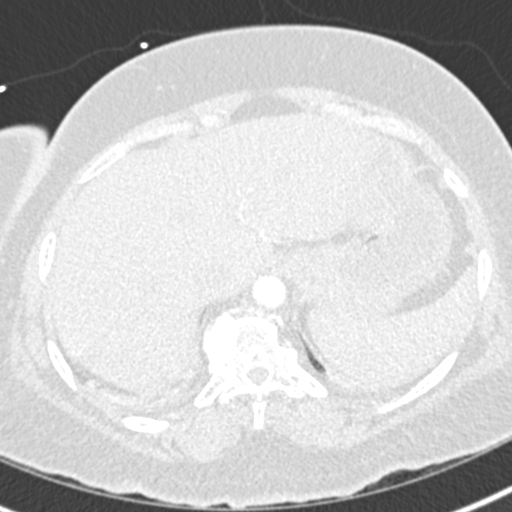
[im 61/272  soft-tissue]
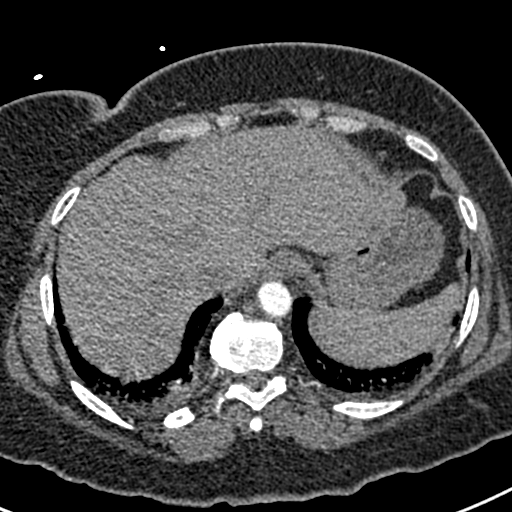
[im 91/272  lung]
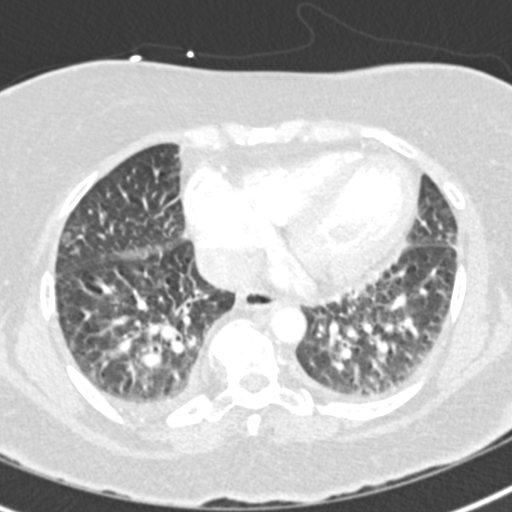
[im 106/272  soft-tissue]
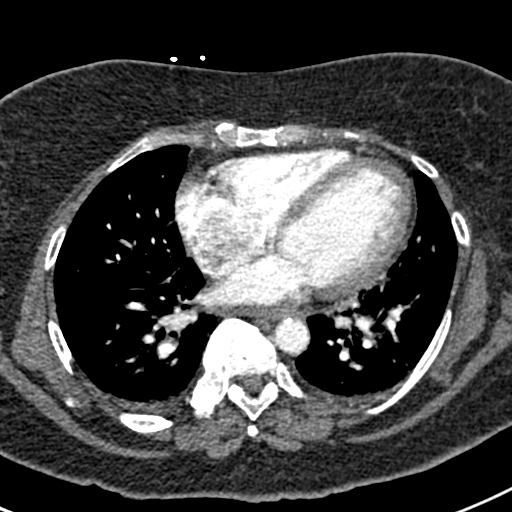
[im 121/272  lung]
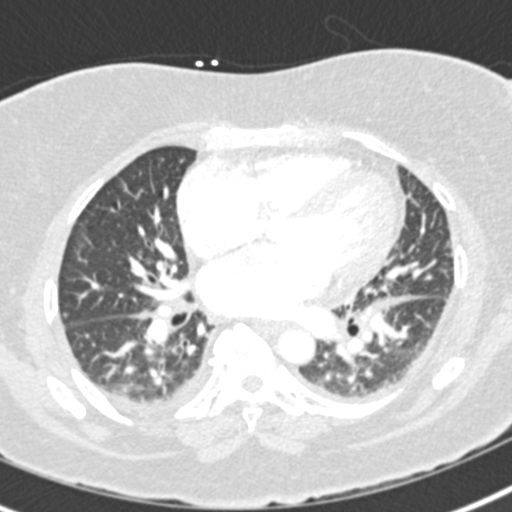
[im 136/272  soft-tissue]
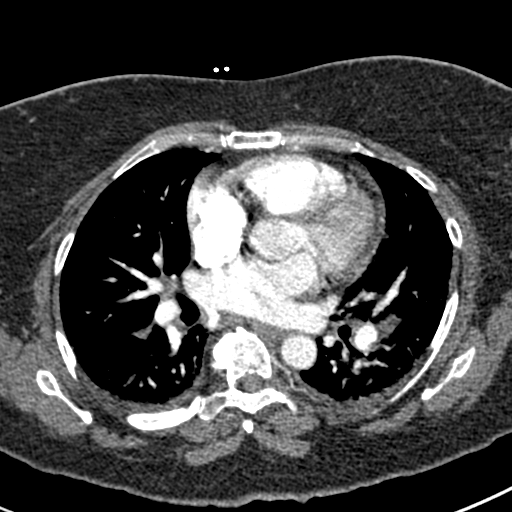
[im 151/272  lung]
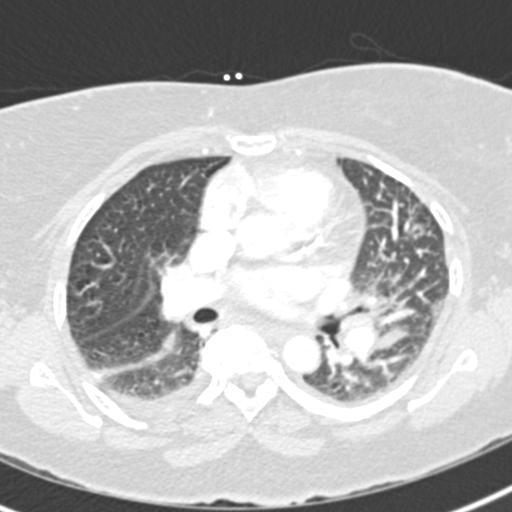
[im 166/272  soft-tissue]
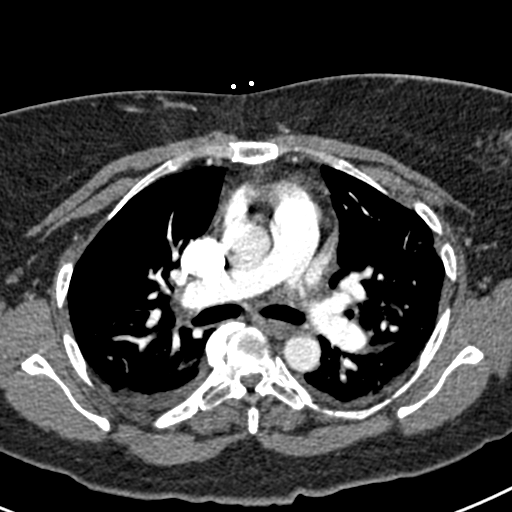
[im 181/272  lung]
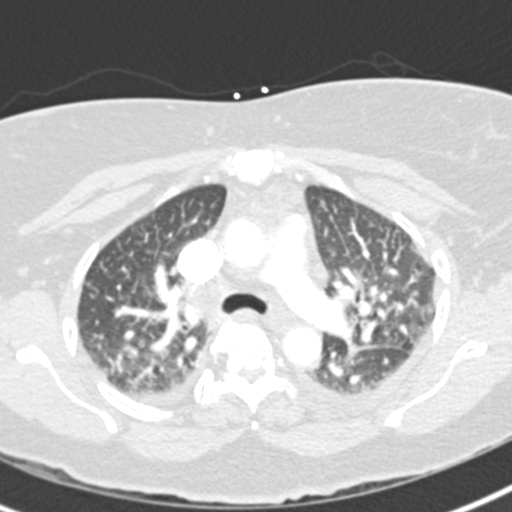
[im 211/272  soft-tissue]
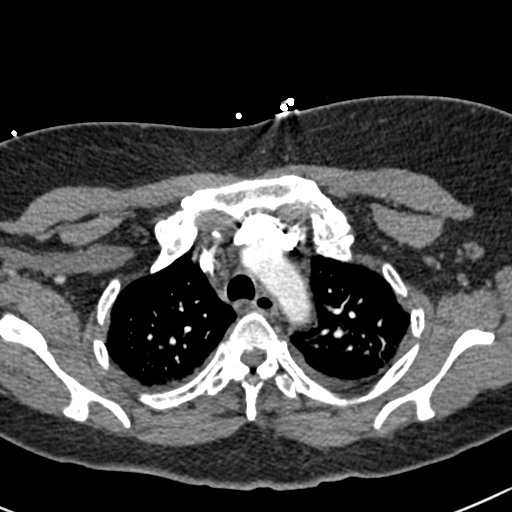
[im 226/272  lung]
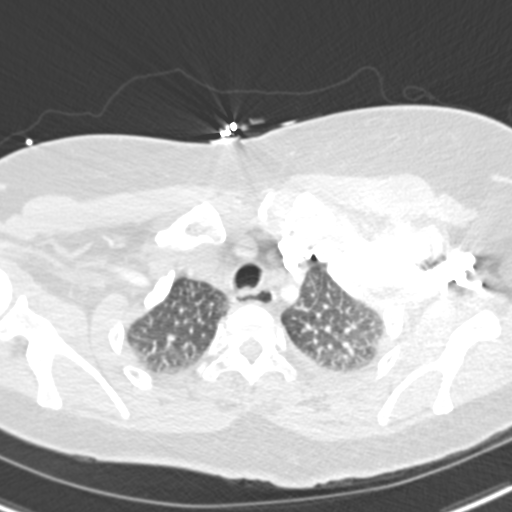
[im 241/272  soft-tissue]
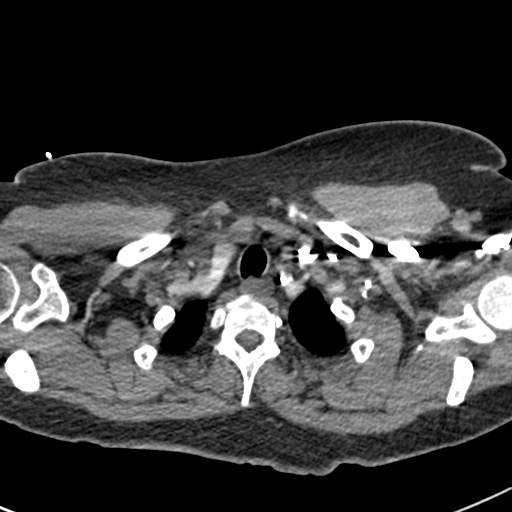
[im 256/272  lung]
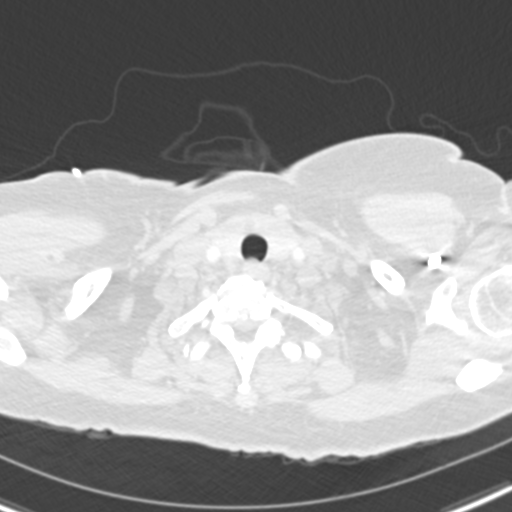

[Series 8: coronal mpr · coronal · 0.53mm/px · 3 of 72 slices shown]
[im 18/72  soft-tissue]
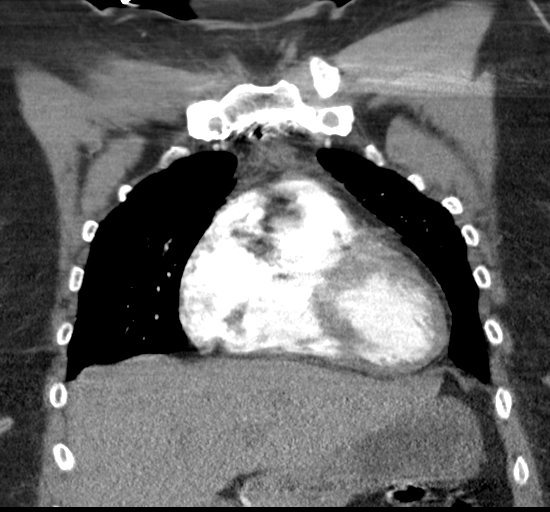
[im 36/72  soft-tissue]
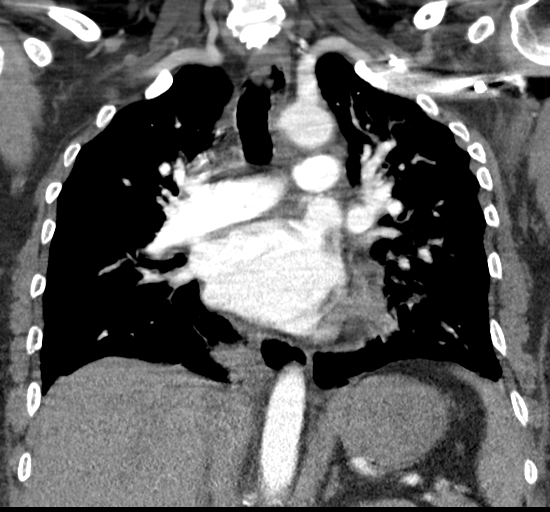
[im 54/72  soft-tissue]
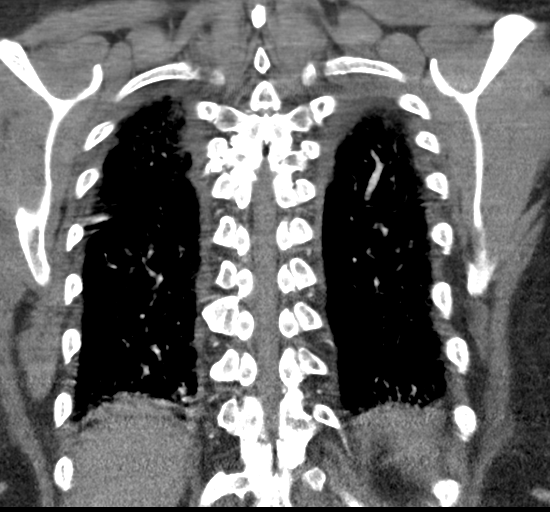

[18 of 46 positions shown; findings below may reference images not displayed]

FINDINGS: Cardiovascular: Mild four-chamber cardiac enlargement. No
pericardial effusion. Satisfactory opacification of pulmonary
arteries noted, and there is no evidence of pulmonary emboli.
Scattered coronary calcifications. Adequate contrast opacification
of the thoracic aorta with no evidence of dissection, aneurysm, or
stenosis. There is classic 3-vessel brachiocephalic arch anatomy
without proximal stenosis. Minimal calcified atheromatous plaque in
the descending thoracic segment. Visualized proximal abdominal aorta
unremarkable.

Mediastinum/Nodes: No hilar or mediastinal adenopathy.

Lungs/Pleura: Trace bilateral pleural effusions. Interstitial edema
with peripheral septal thickening. No pneumothorax.

Upper Abdomen: No acute findings.

Musculoskeletal: Vertebral endplate spurring at multiple levels in
the mid and lower thoracic spine. Negative for fracture or worrisome
bone lesion.

Review of the MIP images confirms the above findings.
IMPRESSION: 1. Negative for acute PE or thoracic aortic dissection.
2. Mild cardiomegaly, interstitial edema, and small pleural
effusions suggesting CHF.

## 2021-06-14 ENCOUNTER — Other Ambulatory Visit: Payer: Self-pay | Admitting: Primary Care

## 2021-06-14 DIAGNOSIS — Z1231 Encounter for screening mammogram for malignant neoplasm of breast: Secondary | ICD-10-CM

## 2021-07-18 ENCOUNTER — Other Ambulatory Visit: Payer: Self-pay

## 2021-07-18 ENCOUNTER — Ambulatory Visit
Admission: RE | Admit: 2021-07-18 | Discharge: 2021-07-18 | Disposition: A | Discharge status: Home / Self Care | Payer: PRIVATE HEALTH INSURANCE | Source: Ambulatory Visit | Attending: Primary Care | Admitting: Primary Care

## 2021-07-18 DIAGNOSIS — Z1231 Encounter for screening mammogram for malignant neoplasm of breast: Secondary | ICD-10-CM | POA: Insufficient documentation

## 2021-09-20 DIAGNOSIS — Z0130 Encounter for examination of blood pressure without abnormal findings: Secondary | ICD-10-CM | POA: Diagnosis not present

## 2021-09-20 DIAGNOSIS — Z1389 Encounter for screening for other disorder: Secondary | ICD-10-CM | POA: Diagnosis not present

## 2021-09-20 DIAGNOSIS — M79672 Pain in left foot: Secondary | ICD-10-CM | POA: Diagnosis not present

## 2022-02-07 ENCOUNTER — Ambulatory Visit (LOCAL_COMMUNITY_HEALTH_CENTER): Payer: Self-pay

## 2022-02-07 DIAGNOSIS — Z111 Encounter for screening for respiratory tuberculosis: Secondary | ICD-10-CM

## 2022-02-10 ENCOUNTER — Ambulatory Visit (LOCAL_COMMUNITY_HEALTH_CENTER): Payer: Self-pay

## 2022-02-10 DIAGNOSIS — Z111 Encounter for screening for respiratory tuberculosis: Secondary | ICD-10-CM

## 2022-02-10 LAB — TB SKIN TEST
Induration: 0 mm
TB Skin Test: NEGATIVE

## 2022-07-10 ENCOUNTER — Encounter: Payer: Self-pay | Admitting: Primary Care

## 2022-07-10 DIAGNOSIS — Z1231 Encounter for screening mammogram for malignant neoplasm of breast: Secondary | ICD-10-CM

## 2022-07-11 ENCOUNTER — Other Ambulatory Visit: Payer: Self-pay

## 2022-07-11 DIAGNOSIS — Z1231 Encounter for screening mammogram for malignant neoplasm of breast: Secondary | ICD-10-CM

## 2022-08-07 ENCOUNTER — Ambulatory Visit: Payer: Self-pay | Attending: Hematology and Oncology | Admitting: Hematology and Oncology

## 2022-08-07 ENCOUNTER — Ambulatory Visit
Admission: RE | Admit: 2022-08-07 | Discharge: 2022-08-07 | Disposition: A | Discharge status: Home / Self Care | Payer: Self-pay | Source: Ambulatory Visit | Attending: Obstetrics and Gynecology | Admitting: Obstetrics and Gynecology

## 2022-08-07 VITALS — BP 149/82 | Wt 210.0 lb

## 2022-08-07 DIAGNOSIS — Z1211 Encounter for screening for malignant neoplasm of colon: Secondary | ICD-10-CM

## 2022-08-07 DIAGNOSIS — Z1231 Encounter for screening mammogram for malignant neoplasm of breast: Secondary | ICD-10-CM

## 2022-08-07 NOTE — Patient Instructions (Signed)
Grace about self breast awareness and gave educational materials to take home. Patient did not need a Pap smear today due to last Pap smear was in 2021 per patient. She will be due for repeat in August 2024. Let her know [facility] will cover Pap smears every 5 years unless has a history of abnormal Pap smears. [facility]. Appointment scheduled for 08/07/22. Patient aware of appointment and will be there. Let patient know will follow up with her within the next couple weeks with results. Sunday Corn verbalized understanding.  Melodye Ped, NP 8:47 AM

## 2022-08-07 NOTE — Progress Notes (Signed)
Mary Smith is Smith 57 y.o. female who presents to [facility] clinic today with no complaints.    Pap Smear: Pap not smear completed today. Last Pap smear was 2021 at [facility] clinic and was normal. Per patient has no history of an abnormal Pap smear. Last Pap smear result is available in Epic. She will be due for repeat Pap smear in August 2024.   Physical exam: Breasts Breasts symmetrical. No skin abnormalities bilateral breasts. No nipple retraction bilateral breasts. No nipple discharge bilateral breasts. No lymphadenopathy. No lumps palpated bilateral breasts.  MM 3D SCREEN BREAST BILATERAL  Result Date: 07/18/2021 CLINICAL DATA:  Screening. EXAM: DIGITAL SCREENING BILATERAL MAMMOGRAM WITH TOMOSYNTHESIS AND CAD TECHNIQUE: Bilateral screening digital craniocaudal and mediolateral oblique mammograms were obtained. Bilateral screening digital breast tomosynthesis was performed. The images were evaluated with computer-aided detection. COMPARISON:  Previous exam(s). ACR Breast Density Category b: There are scattered areas of fibroglandular density. FINDINGS: There are no findings suspicious for malignancy. IMPRESSION: No mammographic evidence of malignancy. Smith result letter of this screening mammogram will be mailed directly to the patient. RECOMMENDATION: Screening mammogram in one year. (Code:SM-B-01Y) BI-RADS CATEGORY  1: Negative. Electronically Signed   By: Mary Smith M.D.   On: 07/18/2021 11:19   MM 3D SCREEN BREAST BILATERAL  Result Date: 05/22/2019 CLINICAL DATA:  Screening. EXAM: DIGITAL SCREENING BILATERAL MAMMOGRAM WITH TOMO AND CAD COMPARISON:  Previous exam(s). ACR Breast Density Category b: There are scattered areas of fibroglandular density. FINDINGS: There are no findings suspicious for malignancy. Images were processed with CAD. IMPRESSION: No mammographic evidence of malignancy. Smith result letter of this screening mammogram will be mailed directly to the patient. RECOMMENDATION:  Screening mammogram in one year. (Code:SM-B-01Y) BI-RADS CATEGORY  1: Negative. Electronically Signed   By: Mary Smith M.D.   On: 05/22/2019 14:10   MS DIGITAL SCREENING TOMO BILATERAL  Result Date: 12/19/2017 CLINICAL DATA:  Screening. EXAM: DIGITAL SCREENING BILATERAL MAMMOGRAM WITH TOMO AND CAD COMPARISON:  Previous exam(s). ACR Breast Density Category b: There are scattered areas of fibroglandular density. FINDINGS: There are no findings suspicious for malignancy. Images were processed with CAD. IMPRESSION: No mammographic evidence of malignancy. Smith result letter of this screening mammogram will be mailed directly to the patient. RECOMMENDATION: Screening mammogram in one year. (Code:SM-B-01Y) BI-RADS CATEGORY  1: Negative. Electronically Signed   By: Mary Smith M.D.   On: 12/19/2017 13:11         Pelvic/Bimanual Pap is not indicated today    Smoking History: Patient has never smoked and was not referred to quit line.    Patient Navigation: Patient education provided. Access to services provided for patient through [facility] program. No interpreter provided. No transportation provided   Colorectal Cancer Screening: Per patient has never had colonoscopy completed No complaints today. FIT test given.   Breast and Cervical Cancer Risk Assessment: Patient has family history of breast cancer with her mother and maternal grandmother. Patient does not have history of cervical dysplasia, immunocompromised, or DES exposure in-utero.  Risk Scores as of 08/07/2022     Mary Smith           5-year 2.2 %   Lifetime 12.27 %            Last calculated by Mary Smith, CMA on 08/07/2022 at  8:56 AM        Smith: [facility] exam without pap smear No complaints with benign exam.   P: Referred patient to the [place]  of  for Smith screening mammogram. Appointment scheduled 08/06/22.  Mary Scrape A, NP 08/07/2022 9:02 AM

## 2024-05-13 ENCOUNTER — Telehealth: Payer: Self-pay

## 2024-05-13 ENCOUNTER — Encounter: Payer: Self-pay | Admitting: Primary Care

## 2024-06-18 ENCOUNTER — Other Ambulatory Visit: Payer: Self-pay | Admitting: Primary Care

## 2024-06-18 DIAGNOSIS — Z1231 Encounter for screening mammogram for malignant neoplasm of breast: Secondary | ICD-10-CM

## 2024-07-09 ENCOUNTER — Ambulatory Visit: Payer: Self-pay

## 2024-08-04 ENCOUNTER — Ambulatory Visit: Payer: Self-pay
